# Patient Record
Sex: Female | Born: 1950 | Hispanic: No
Health system: Southern US, Community
[De-identification: ages and names within clinical notes are randomized; demographics above are authoritative.]

## PROBLEM LIST (undated history)

## (undated) DIAGNOSIS — E669 Obesity, unspecified: Secondary | ICD-10-CM

---

## 1998-04-28 ENCOUNTER — Other Ambulatory Visit: Admission: RE | Admit: 1998-04-28 | Discharge: 1998-04-28 | Payer: Self-pay | Admitting: Obstetrics and Gynecology

## 2007-09-08 ENCOUNTER — Ambulatory Visit: Payer: Self-pay | Admitting: Internal Medicine

## 2019-08-19 ENCOUNTER — Other Ambulatory Visit: Payer: Self-pay | Admitting: Physician Assistant

## 2019-08-19 DIAGNOSIS — Z1231 Encounter for screening mammogram for malignant neoplasm of breast: Secondary | ICD-10-CM

## 2019-09-22 ENCOUNTER — Ambulatory Visit: Payer: Self-pay

## 2020-06-01 ENCOUNTER — Ambulatory Visit: Payer: Self-pay

## 2021-01-27 ENCOUNTER — Emergency Department (HOSPITAL_COMMUNITY): Payer: Medicare PPO

## 2021-01-27 ENCOUNTER — Observation Stay (HOSPITAL_BASED_OUTPATIENT_CLINIC_OR_DEPARTMENT_OTHER): Payer: Medicare PPO

## 2021-01-27 ENCOUNTER — Observation Stay (HOSPITAL_COMMUNITY): Payer: Medicare PPO

## 2021-01-27 ENCOUNTER — Other Ambulatory Visit: Payer: Self-pay

## 2021-01-27 ENCOUNTER — Observation Stay (HOSPITAL_COMMUNITY)
Admission: EM | Admit: 2021-01-27 | Discharge: 2021-01-28 | Disposition: A | Discharge status: Home / Self Care | Payer: Medicare PPO | Attending: Internal Medicine | Admitting: Internal Medicine

## 2021-01-27 ENCOUNTER — Encounter (HOSPITAL_COMMUNITY): Payer: Self-pay | Admitting: Emergency Medicine

## 2021-01-27 DIAGNOSIS — Z6831 Body mass index (BMI) 31.0-31.9, adult: Secondary | ICD-10-CM | POA: Insufficient documentation

## 2021-01-27 DIAGNOSIS — G458 Other transient cerebral ischemic attacks and related syndromes: Secondary | ICD-10-CM | POA: Diagnosis not present

## 2021-01-27 DIAGNOSIS — G459 Transient cerebral ischemic attack, unspecified: Secondary | ICD-10-CM | POA: Diagnosis not present

## 2021-01-27 DIAGNOSIS — R0689 Other abnormalities of breathing: Secondary | ICD-10-CM | POA: Diagnosis present

## 2021-01-27 DIAGNOSIS — E669 Obesity, unspecified: Secondary | ICD-10-CM | POA: Insufficient documentation

## 2021-01-27 DIAGNOSIS — R569 Unspecified convulsions: Secondary | ICD-10-CM | POA: Diagnosis not present

## 2021-01-27 DIAGNOSIS — Y900 Blood alcohol level of less than 20 mg/100 ml: Secondary | ICD-10-CM | POA: Diagnosis not present

## 2021-01-27 DIAGNOSIS — R29898 Other symptoms and signs involving the musculoskeletal system: Secondary | ICD-10-CM | POA: Insufficient documentation

## 2021-01-27 DIAGNOSIS — Z20822 Contact with and (suspected) exposure to covid-19: Secondary | ICD-10-CM | POA: Insufficient documentation

## 2021-01-27 DIAGNOSIS — G9340 Encephalopathy, unspecified: Secondary | ICD-10-CM | POA: Diagnosis not present

## 2021-01-27 DIAGNOSIS — R531 Weakness: Secondary | ICD-10-CM

## 2021-01-27 HISTORY — DX: Obesity, unspecified: E66.9

## 2021-01-27 LAB — CBC
HCT: 41.7 % (ref 36.0–46.0)
Hemoglobin: 13.7 g/dL (ref 12.0–15.0)
MCH: 32.2 pg (ref 26.0–34.0)
MCHC: 32.9 g/dL (ref 30.0–36.0)
MCV: 97.9 fL (ref 80.0–100.0)
Platelets: 158 10*3/uL (ref 150–400)
RBC: 4.26 MIL/uL (ref 3.87–5.11)
RDW: 12.3 % (ref 11.5–15.5)
WBC: 7 10*3/uL (ref 4.0–10.5)
nRBC: 0 % (ref 0.0–0.2)

## 2021-01-27 LAB — DIFFERENTIAL
Abs Immature Granulocytes: 0.05 10*3/uL (ref 0.00–0.07)
Basophils Absolute: 0 10*3/uL (ref 0.0–0.1)
Basophils Relative: 1 %
Eosinophils Absolute: 0.2 10*3/uL (ref 0.0–0.5)
Eosinophils Relative: 3 %
Immature Granulocytes: 1 %
Lymphocytes Relative: 27 %
Lymphs Abs: 1.9 10*3/uL (ref 0.7–4.0)
Monocytes Absolute: 0.7 10*3/uL (ref 0.1–1.0)
Monocytes Relative: 9 %
Neutro Abs: 4.2 10*3/uL (ref 1.7–7.7)
Neutrophils Relative %: 59 %

## 2021-01-27 LAB — COMPREHENSIVE METABOLIC PANEL
ALT: 22 U/L (ref 0–44)
AST: 31 U/L (ref 15–41)
Albumin: 3.5 g/dL (ref 3.5–5.0)
Alkaline Phosphatase: 74 U/L (ref 38–126)
Anion gap: 8 (ref 5–15)
BUN: 11 mg/dL (ref 8–23)
CO2: 21 mmol/L — ABNORMAL LOW (ref 22–32)
Calcium: 8.5 mg/dL — ABNORMAL LOW (ref 8.9–10.3)
Chloride: 110 mmol/L (ref 98–111)
Creatinine, Ser: 0.91 mg/dL (ref 0.44–1.00)
GFR, Estimated: >60 mL/min (ref >60)
Glucose, Bld: 120 mg/dL — ABNORMAL HIGH (ref 70–99)
Potassium: 3.8 mmol/L (ref 3.5–5.1)
Sodium: 139 mmol/L (ref 135–145)
Total Bilirubin: 0.8 mg/dL (ref 0.3–1.2)
Total Protein: 6 g/dL — ABNORMAL LOW (ref 6.5–8.1)

## 2021-01-27 LAB — ECHOCARDIOGRAM COMPLETE
Area-P 1/2: 3.85 cm2
Height: 63 in
S' Lateral: 2.3 cm
Weight: 2848 oz

## 2021-01-27 LAB — URINALYSIS, ROUTINE W REFLEX MICROSCOPIC
Bilirubin Urine: NEGATIVE
Glucose, UA: NEGATIVE mg/dL
Hgb urine dipstick: NEGATIVE
Ketones, ur: NEGATIVE mg/dL
Nitrite: NEGATIVE
Protein, ur: NEGATIVE mg/dL
Specific Gravity, Urine: 1.017 (ref 1.005–1.030)
pH: 5 (ref 5.0–8.0)

## 2021-01-27 LAB — RESP PANEL BY RT-PCR (FLU A&B, COVID) ARPGX2
Influenza A by PCR: NEGATIVE
Influenza B by PCR: NEGATIVE
SARS Coronavirus 2 by RT PCR: NEGATIVE

## 2021-01-27 LAB — I-STAT CHEM 8, ED
BUN: 12 mg/dL (ref 8–23)
Calcium, Ion: 1.12 mmol/L — ABNORMAL LOW (ref 1.15–1.40)
Chloride: 110 mmol/L (ref 98–111)
Creatinine, Ser: 0.8 mg/dL (ref 0.44–1.00)
Glucose, Bld: 119 mg/dL — ABNORMAL HIGH (ref 70–99)
HCT: 39 % (ref 36.0–46.0)
Hemoglobin: 13.3 g/dL (ref 12.0–15.0)
Potassium: 3.8 mmol/L (ref 3.5–5.1)
Sodium: 142 mmol/L (ref 135–145)
TCO2: 22 mmol/L (ref 22–32)

## 2021-01-27 LAB — RAPID URINE DRUG SCREEN, HOSP PERFORMED
Amphetamines: NOT DETECTED
Barbiturates: NOT DETECTED
Benzodiazepines: NOT DETECTED
Cocaine: NOT DETECTED
Opiates: NOT DETECTED
Tetrahydrocannabinol: NOT DETECTED

## 2021-01-27 LAB — APTT: aPTT: 28 seconds (ref 24–36)

## 2021-01-27 LAB — MAGNESIUM: Magnesium: 2.2 mg/dL (ref 1.7–2.4)

## 2021-01-27 LAB — ETHANOL: Alcohol, Ethyl (B): <10 mg/dL (ref <10)

## 2021-01-27 LAB — PROTIME-INR
INR: 1 (ref 0.8–1.2)
Prothrombin Time: 13.2 seconds (ref 11.4–15.2)

## 2021-01-27 LAB — HEMOGLOBIN A1C
Hgb A1c MFr Bld: 5.3 % (ref 4.8–5.6)
Mean Plasma Glucose: 105.41 mg/dL

## 2021-01-27 LAB — CHOLESTEROL, TOTAL: Cholesterol: 191 mg/dL (ref 0–200)

## 2021-01-27 MED ORDER — ACETAMINOPHEN 160 MG/5ML PO SOLN: 650.0000 mg | ORAL | Status: DC | PRN

## 2021-01-27 MED ORDER — ASPIRIN EC 81 MG PO TBEC
81.0000 mg | DELAYED_RELEASE_TABLET | Freq: Every day | ORAL | Status: DC
  Administered 2021-01-27 – 2021-01-28 (×2): 81 mg via ORAL
  Filled 2021-01-27 (×2): qty 1

## 2021-01-27 MED ORDER — ACETAMINOPHEN 325 MG PO TABS: 650.0000 mg | ORAL_TABLET | ORAL | Status: DC | PRN

## 2021-01-27 MED ORDER — STROKE: EARLY STAGES OF RECOVERY BOOK
Freq: Once | Status: AC
  Filled 2021-01-27: qty 1

## 2021-01-27 MED ORDER — ACETAMINOPHEN 650 MG RE SUPP: 650.0000 mg | RECTAL | Status: DC | PRN

## 2021-01-27 MED ORDER — SENNOSIDES-DOCUSATE SODIUM 8.6-50 MG PO TABS: 1.0000 | ORAL_TABLET | Freq: Every evening | ORAL | Status: DC | PRN

## 2021-01-27 MED ORDER — ENOXAPARIN SODIUM 30 MG/0.3ML IJ SOSY
30.0000 mg | PREFILLED_SYRINGE | INTRAMUSCULAR | Status: DC
  Filled 2021-01-27: qty 0.3

## 2021-01-27 MED ORDER — ONDANSETRON HCL 4 MG PO TABS: 4.0000 mg | ORAL_TABLET | Freq: Three times a day (TID) | ORAL | Status: DC | PRN

## 2021-01-27 NOTE — Progress Notes (Addendum)
Called to bedside to discuss finding and plan of care with patient and her daughter.  The patient and her daughter at bedside was met, all questions were answered, updated regarding current findings. Patient reports she has some cardiovascular disorder, with easily bruising. Requested if Lovenox could be discontinued..  Per patient request and her daughter Lovenox will be discontinued. Patient will be continued on aspirin, and SCDs.   Current findings plan of care has been discussed with the patient and daughter in detail, they currently expressed understanding and agreement with current plan.    SIGNED: Deatra James, MD, FHM. [facility],  Pager (please use [email or web address] to page/text)  Please use Epic Secure Chat for non-urgent communication (7AM-7PM) If 7PM-7AM, please contact night-coverage [email or web address],  01/27/2021, 2:32 PM

## 2021-01-27 NOTE — ED Triage Notes (Signed)
Pt BIB EMS from home. PT husband reports that they went to sleep around 930pm and around 140am he woke up to her heavily breathing and could not wake her up. Fire reported that pt had l sided weakness and l sided facial droop. When EMS arrived pt was AOx4 but slow to respond - answered questions appropriately and had no weakness or facial drip.  Pt has no medical hx and does not take any meds.  20LAC  Bp 156/84 EKG show NSR  CBG 129  Pt on RA

## 2021-01-27 NOTE — Progress Notes (Signed)
SLP Cancellation Note  Patient Details Name: Stacey Conrad MRN: [id number] DOB: Jan 31, 1951   Cancelled treatment:       Reason Eval/Treat Not Completed: SLP screened, no needs identified, will sign off  Katherine Tout L. Samson Frederic, MA CCC/SLP Acute Rehabilitation Services Office number [phone] Pager [phone]  Stacey Conrad [name] 01/27/2021, 3:01 PM

## 2021-01-27 NOTE — Progress Notes (Signed)
EEG completed, results pending No skin breakdown observed.

## 2021-01-27 NOTE — Progress Notes (Signed)
Physical Therapy Evaluation and Discharge Patient Details Name: Stacey Conrad MRN: [id number] DOB: 1950/10/08 Today's Date: 01/27/2021   History of Present Illness  Pt is a 70 y/o female admitted secondary to AMS likely from encephalopathy. MRI negative. No pertinent PMH.  Clinical Impression  Patient evaluated by Physical Therapy with no further acute PT needs identified. All education has been completed and the patient has no further questions. Pt overall steady and performing gait and stair navigation at a mod I level. No LOB noted and pt asymptomatic throughout. Pt reports husband can assist if needed at d/c. See below for any follow-up Physical Therapy or equipment needs. PT is signing off. Thank you for this referral. If needs change, please re-consult.      Follow Up Recommendations No PT follow up    Equipment Recommendations  None recommended by PT    Recommendations for Other Services       Precautions / Restrictions Precautions Precautions: None Restrictions Weight Bearing Restrictions: No      Mobility  Bed Mobility Overal bed mobility: Independent                  Transfers Overall transfer level: Modified independent                  Ambulation/Gait Ambulation/Gait assistance: Modified independent (Device/Increase time) Gait Distance (Feet): 150 Feet Assistive device: None Gait Pattern/deviations: Step-through pattern;Decreased stride length Gait velocity: mildly decreased   General Gait Details: Mildly slower gait speed, but overall steady. Reporting mild stiffness in L knee, but otherwise tolerated well.  Stairs Stairs: Yes Stairs assistance: Modified independent (Device/Increase time) Stair Management: One rail Right;Alternating pattern Number of Stairs: 4 General stair comments: Overall steady gait using rail. No LOB noted.  Wheelchair Mobility    Modified Rankin (Stroke Patients Only)       Balance Overall balance  assessment: Modified Independent                                           Pertinent Vitals/Pain Pain Assessment: No/denies pain    Home Living Family/patient expects to be discharged to:: Private residence Living Arrangements: Spouse/significant other Available Help at Discharge: Family Type of Home: House Home Access: Stairs to enter Entrance Stairs-Rails: Right Entrance Stairs-Number of Steps: 7 Home Layout: One level Home Equipment: None      Prior Function Level of Independence: Independent               Hand Dominance        Extremity/Trunk Assessment   Upper Extremity Assessment Upper Extremity Assessment: Overall WFL for tasks assessed    Lower Extremity Assessment Lower Extremity Assessment: LLE deficits/detail LLE Deficits / Details: Reporting L knee stiffness    Cervical / Trunk Assessment Cervical / Trunk Assessment: Normal  Communication   Communication: No difficulties  Cognition Arousal/Alertness: Awake/alert Behavior During Therapy: WFL for tasks assessed/performed Overall Cognitive Status: Within Functional Limits for tasks assessed                                        General Comments General comments (skin integrity, edema, etc.): Pt's husband present at end of session.    Exercises     Assessment/Plan    PT Assessment Patent does not need any  further PT services  PT Problem List         PT Treatment Interventions      PT Goals (Current goals can be found in the Care Plan section)  Acute Rehab PT Goals Patient Stated Goal: to go home PT Goal Formulation: With patient Time For Goal Achievement: 01/27/21 Potential to Achieve Goals: Good    Frequency     Barriers to discharge        Co-evaluation               AM-PAC PT "6 Clicks" Mobility  Outcome Measure Help needed turning from your back to your side while in a flat bed without using bedrails?: None Help needed moving  from lying on your back to sitting on the side of a flat bed without using bedrails?: None Help needed moving to and from a bed to a chair (including a wheelchair)?: None Help needed standing up from a chair using your arms (e.g., wheelchair or bedside chair)?: None Help needed to walk in hospital room?: None Help needed climbing 3-5 steps with a railing? : None 6 Click Score: 24    End of Session Equipment Utilized During Treatment: Gait belt Activity Tolerance: Patient tolerated treatment well Patient left: in chair;with call bell/phone within reach;with family/visitor present Nurse Communication: Mobility status PT Visit Diagnosis: Other symptoms and signs involving the nervous system (R29.898)    Time: 0086-7619 PT Time Calculation (min) (ACUTE ONLY): 17 min   Charges:   PT Evaluation $PT Eval Low Complexity: 1 Low          Cindee Salt, DPT  Acute Rehabilitation Services  Pager: [phone] Office: [phone]   Lehman Prom 01/27/2021, 4:29 PM

## 2021-01-27 NOTE — Progress Notes (Signed)
Echocardiogram 2D Echocardiogram with strain and 3D has been performed.  Leta Jungling M 01/27/2021, 1:48 PM

## 2021-01-27 NOTE — H&P (Addendum)
History and Physical    Stacey Conrad:[id number] DOB: 1951-01-25 DOA: 01/27/2021  Referring MD/NP/PA:  PCP: [facility] Patient coming from: home  Chief Complaint: acute encephalopathy  HPI: Stacey Conrad is a very pleasant 70 y.o. female with no significant medical history presents to the emergency department chief complaint of acute encephalopathy.  Formations obtained from the patient and her husband and daughter who are both at the bedside.  Patient reports she was in her usual state of health went to bed last night.  Husband said in the middle of the night she began "thrashing her legs about in bed and her breathing became rapid and deep".  He reports he tried to arouse her and she was not responsive.  Her eyes were not open.  There was no incontinence.  Daughter who is a nurse came and at that time patient's eyes were open she was not thrashing about in bed but was unresponsive to verbal stimuli.  #1 1 was called who noted some possible left-sided weakness.  Within about 10 minutes she came around.  Her only memory is going to bed last night and awakening to a "room full of people".  She denies headache dizziness visual disturbances.  She denies any chest pain palpitations shortness of breath.  She denies abdominal pain nausea vomiting diarrhea constipation melena.  She denies dysuria hematuria frequency or urgency.  She she denies any numbness or tingling of her extremities difficulty chewing or swallowing.  Was transported to the emergency department.  ED Course: In the emergency department she was provided with aspirin.  No code stroke was initiated as there were no neurological symptoms upon arrival.  Neurology was contacted by telephone who recommended a full TIA work-up and admission to medicine.  As the swallow eval in the emergency department.  Dr. Amada Jupiter indicated patient will be seen in consultation.  Review of Systems: As per HPI otherwise 10 point review of  systems negative.   Past Medical History:  Diagnosis Date  . Obesity     History reviewed. No pertinent surgical history.   has no history on file for tobacco use, alcohol use, and drug use.  Allergies  Allergen Reactions  . Lidocaine-Epinephrine Swelling    Family History  Problem Relation Age of Onset  . CAD Mother      Prior to Admission medications   Medication Sig Start Date End Date Taking? Authorizing Provider  b complex vitamins capsule Take 1 capsule by mouth daily as needed (when exercising).   Yes [provider]  ibuprofen (ADVIL) 200 MG tablet Take 200 mg by mouth every 6 (six) hours as needed for headache or moderate pain.   Yes [provider]    Physical Exam: Vitals:   01/27/21 0500 01/27/21 0621 01/27/21 0630 01/27/21 0700  BP: 138/62 (!) 155/69 (!) 145/68 (!) 143/61  Pulse: 84 80 76 70  Resp: (!) Temp:      TempSrc:      SpO2: 98% 99% 98% 97%  Weight:  80.7 kg    Height:   (1.6 m)        Constitutional: NAD, calm, comfortable Vitals:   01/27/21 0500 01/27/21 0621 01/27/21 0630 01/27/21 0700  BP: 138/62 (!) 155/69 (!) 145/68 (!) 143/61  Pulse: 84 80 76 70  Resp: (!) Temp:      TempSrc:      SpO2: 98% 99% 98% 97%  Weight:  80.7 kg    Height:  5\' 3"  (1.6 m)     Eyes: PERRL, lids and conjunctivae normal ENMT: Mucous membranes are slightly dry. Posterior pharynx clear of any exudate or lesions.Normal dentition. Tongue with red patches at tip and on left. No bleeding Neck: normal, supple, no masses, no thyromegaly Respiratory: clear to auscultation bilaterally, no wheezing, no crackles. Normal respiratory effort. No accessory muscle use.  Cardiovascular: Regular rate and rhythm, no murmurs / rubs / gallops. No extremity edema. 2+ pedal pulses. No carotid bruits.  Abdomen: no tenderness, obese, no masses palpated. No hepatosplenomegaly. Bowel sounds positive.  Musculoskeletal: no clubbing /  cyanosis. No joint deformity upper and lower extremities. Good ROM, no contractures. Normal muscle tone.  Skin: no rashes, lesions, ulcers. No induration Neurologic: CN 2-12 grossly intact. Sensation intact, DTR normal. Strength 5/5 in all 4.  Psychiatric: Normal judgment and insight. Alert and oriented x 3. Normal mood.   Labs on Admission: I have personally reviewed following labs and imaging studies  CBC: Recent Labs  Lab 01/27/21 0321 01/27/21 0342  WBC 7.0  --   NEUTROABS 4.2  --   HGB 13.7 13.3  HCT 41.7 39.0  MCV 97.9  --   PLT 158  --    Basic Metabolic Panel: Recent Labs  Lab 01/27/21 0321 01/27/21 0342  NA 139 142  K 3.8 3.8  CL 110 110  CO2 21*  --   GLUCOSE 120* 119*  BUN 11 12  CREATININE 0.91 0.80  CALCIUM 8.5*  --    GFR: Estimated Creatinine Clearance: 64.9 mL/min (by C-G formula based on SCr of 0.8 mg/dL). Liver Function Tests: Recent Labs  Lab 01/27/21 0321  AST 31  ALT 22  ALKPHOS 74  BILITOT 0.8  PROT 6.0*  ALBUMIN 3.5   No results for input(s): LIPASE, AMYLASE in the last 168 hours. No results for input(s): AMMONIA in the last 168 hours. Coagulation Profile: Recent Labs  Lab 01/27/21 0321  INR 1.0   Cardiac Enzymes: No results for input(s): CKTOTAL, CKMB, CKMBINDEX, TROPONINI in the last 168 hours. BNP (last 3 results) No results for input(s): PROBNP in the last 8760 hours. HbA1C: Recent Labs    01/27/21 0328  HGBA1C 5.3   CBG: No results for input(s): GLUCAP in the last 168 hours. Lipid Profile: Recent Labs    01/27/21 0321  CHOL 191   Thyroid Function Tests: No results for input(s): TSH, T4TOTAL, FREET4, T3FREE, THYROIDAB in the last 72 hours. Anemia Panel: No results for input(s): VITAMINB12, FOLATE, FERRITIN, TIBC, IRON, RETICCTPCT in the last 72 hours. Urine analysis:    Component Value Date/Time   COLORURINE YELLOW 01/27/2021 0502   APPEARANCEUR CLEAR 01/27/2021 0502   LABSPEC 1.017 01/27/2021 0502   PHURINE  5.0 01/27/2021 0502   GLUCOSEU NEGATIVE 01/27/2021 0502   HGBUR NEGATIVE 01/27/2021 0502   BILIRUBINUR NEGATIVE 01/27/2021 0502   KETONESUR NEGATIVE 01/27/2021 0502   PROTEINUR NEGATIVE 01/27/2021 0502   NITRITE NEGATIVE 01/27/2021 0502   LEUKOCYTESUR TRACE (A) 01/27/2021 0502   Sepsis Labs: @LABRCNTIP (procalcitonin:4,lacticidven:4) ) Recent Results (from the past 240 hour(s))  Resp Panel by RT-PCR (Flu A&B, Covid) Nasopharyngeal Swab     Status: None   Collection Time: 01/27/21  3:21 AM   Specimen: Nasopharyngeal Swab; Nasopharyngeal(NP) swabs in vial transport medium  Result Value Ref Range Status   SARS Coronavirus 2 by RT PCR NEGATIVE NEGATIVE Final    Comment: (NOTE) SARS-CoV-2 target nucleic acids are NOT  DETECTED.  The SARS-CoV-2 RNA is generally detectable in upper respiratory specimens during the acute phase of infection. The lowest concentration of SARS-CoV-2 viral copies this assay can detect is 138 copies/mL. A negative result does not preclude SARS-Cov-2 infection and should not be used as the sole basis for treatment or other patient management decisions. A negative result may occur with  improper specimen collection/handling, submission of specimen other than nasopharyngeal swab, presence of viral mutation(s) within the areas targeted by this assay, and inadequate number of viral copies(<138 copies/mL). A negative result must be combined with clinical observations, patient history, and epidemiological information. The expected result is Negative.  Fact Sheet for Patients:  BloggerCourse.com  Fact Sheet for Healthcare Providers:  SeriousBroker.it  This test is no t yet approved or cleared by the [place] FDA and  has been authorized for detection and/or diagnosis of SARS-CoV-2 by FDA under an Emergency Use Authorization (EUA). This EUA will remain  in effect (meaning this test can be used) for the duration  of the COVID-19 declaration under Section 564(b)(1) of the Act, 21 U.S.C.section 360bbb-3(b)(1), unless the authorization is terminated  or revoked sooner.       Influenza A by PCR NEGATIVE NEGATIVE Final   Influenza B by PCR NEGATIVE NEGATIVE Final    Comment: (NOTE) The Xpert Xpress SARS-CoV-2/FLU/RSV plus assay is intended as an aid in the diagnosis of influenza from Nasopharyngeal swab specimens and should not be used as a sole basis for treatment. Nasal washings and aspirates are unacceptable for Xpert Xpress SARS-CoV-2/FLU/RSV testing.  Fact Sheet for Patients: BloggerCourse.com  Fact Sheet for Healthcare Providers: SeriousBroker.it  This test is not yet approved or cleared by the [place] FDA and has been authorized for detection and/or diagnosis of SARS-CoV-2 by FDA under an Emergency Use Authorization (EUA). This EUA will remain in effect (meaning this test can be used) for the duration of the COVID-19 declaration under Section 564(b)(1) of the Act, 21 U.S.C. section 360bbb-3(b)(1), unless the authorization is terminated or revoked.  Performed at [facility], [address], [place], [place] [address]      Radiological Exams on Admission: MR ANGIO HEAD WO CONTRAST  Result Date: 01/27/2021 CLINICAL DATA:  Difficult to arouse. EXAM: MRI HEAD WITHOUT CONTRAST MRA HEAD WITHOUT CONTRAST TECHNIQUE: Multiplanar, multi-echo pulse sequences of the brain and surrounding structures were acquired without intravenous contrast. Angiographic images of the Circle of Willis were acquired using MRA technique without intravenous contrast. COMPARISON: No pertinent prior exam. COMPARISON:  Head CT from earlier today FINDINGS: MRI HEAD FINDINGS Brain: No acute infarction, hemorrhage, hydrocephalus, extra-axial collection or mass lesion. Mild chronic small vessel ischemia in the cerebral white matter. Age normal brain volume  Vascular: Normal flow voids. Skull and upper cervical spine: Normal marrow signal. Sinuses/Orbits: Bilateral cataract resection MRA HEAD FINDINGS Anterior circulation: No stenosis, beading, branch occlusion, aneurysm, or vascular malformation. Posterior circulation: The right vertebral artery is dominant. Fetal type left PCA. No stenosis, branch occlusion, beading, or aneurysm. Anatomic variants: As above IMPRESSION: 1. No acute finding or explanation for symptoms. 2. Mild chronic small vessel ischemia. 3. Negative intracranial MRA. Electronically Signed   By: Marnee Spring M.D.   On: 01/27/2021 06:17   MR BRAIN WO CONTRAST  Result Date: 01/27/2021 CLINICAL DATA:  Difficult to arouse. EXAM: MRI HEAD WITHOUT CONTRAST MRA HEAD WITHOUT CONTRAST TECHNIQUE: Multiplanar, multi-echo pulse sequences of the brain and surrounding structures were acquired without intravenous contrast. Angiographic images of the Circle  of Willis were acquired using MRA technique without intravenous contrast. COMPARISON: No pertinent prior exam. COMPARISON:  Head CT from earlier today FINDINGS: MRI HEAD FINDINGS Brain: No acute infarction, hemorrhage, hydrocephalus, extra-axial collection or mass lesion. Mild chronic small vessel ischemia in the cerebral white matter. Age normal brain volume Vascular: Normal flow voids. Skull and upper cervical spine: Normal marrow signal. Sinuses/Orbits: Bilateral cataract resection MRA HEAD FINDINGS Anterior circulation: No stenosis, beading, branch occlusion, aneurysm, or vascular malformation. Posterior circulation: The right vertebral artery is dominant. Fetal type left PCA. No stenosis, branch occlusion, beading, or aneurysm. Anatomic variants: As above IMPRESSION: 1. No acute finding or explanation for symptoms. 2. Mild chronic small vessel ischemia. 3. Negative intracranial MRA. Electronically Signed   By: Marnee SpringJonathon  Watts M.D.   On: 01/27/2021 06:17   DG Chest Portable 1 View  Result Date:  01/27/2021 CLINICAL DATA:  TIA.  Woke up with heavy breathing. EXAM: PORTABLE CHEST 1 VIEW COMPARISON:  None. FINDINGS: The heart size and mediastinal contours are within normal limits. No focal consolidation. No pulmonary edema. No pleural effusion. No pneumothorax. No acute osseous abnormality. IMPRESSION: No active disease. Electronically Signed   By: Tish FredericksonMorgane  Naveau M.D.   On: 01/27/2021 04:07   CT HEAD CODE STROKE WO CONTRAST  Result Date: 01/27/2021 CLINICAL DATA:  Code stroke.  TIA with symptoms not specified EXAM: CT HEAD WITHOUT CONTRAST TECHNIQUE: Contiguous axial images were obtained from the base of the skull through the vertex without intravenous contrast. COMPARISON:  None. FINDINGS: Brain: No evidence of acute infarction, hemorrhage, hydrocephalus, extra-axial collection or mass lesion/mass effect. Mild periventricular chronic small vessel ischemia. Age normal brain volume Vascular: No hyperdense vessel or unexpected calcification. Skull: Normal. Negative for fracture or focal lesion. Sinuses/Orbits: No acute finding. ASPECTS [facility]([place] Stroke Program Early CT Score) Not scored with this history IMPRESSION: No acute finding.  Unremarkable appearance of the brain for age. Electronically Signed   By: Marnee SpringJonathon  Watts M.D.   On: 01/27/2021 04:06    EKG: Independently reviewed. NSR  Assessment/Plan Principal Problem:   Encephalopathy acute Active Problems:   TIA (transient ischemic attack)   Left-sided weakness   Acute encephalopathy  #1.  Acute encephalopathy/TIA/questionable seizure.  No signs of infection.  No indication metabolic derangement.  Chest x-ray without acute findings.  No events on EKG.  Symptoms resolved in the emergency department.  Head with no acute findings.  MRI/MRA of the head without contrast no acute findings or explanation for symptoms.  Neuro exam within the limits of normal.  By neurology recommended medicine admission with TIA work-up.  Neurology also recommended  EEG. -EEG per neurology -Magnesium level per neurology; lipid panel -Neurochecks per protocol -PT OT consult -Aspirin -Further recommendations per neurology   DVT prophylaxis: Lovenox  Code Status: Full Family Communication: Husband and daughter at bedside Disposition Plan: Home when work-up complete may be late today likely tomorrow Consults called: Dr. Amada JupiterKirkpatrick with neurology Admission status: Obs  Gwenyth BenderBLACK,KAREN M NP [facility]   If 7PM-7AM, please contact night-coverage [email or web address] Password [email or web address]  01/27/2021, 8:41 AM   Addendum:  Attestation: The patient was seen and examined independently, all history, labs, and work-up has reviewed independently.     Current findings, plan of care care was discussed with NP in detail. I agree with all current findings and plan of care.   Plan of care was also discussed with patient patient, she expresses understanding and agreement with current plan.   SIGNED: Kendell BaneSeyed A Creola Krotz, MD, FHM.  [facility],  Pager (please use [email or web address] to page/text)  Please use Epic Secure Chat for non-urgent communication (7AM-7PM) If 7PM-7AM, please contact night-coverage [email or web address],  01/27/2021, 10:44 AM

## 2021-01-27 NOTE — Procedures (Signed)
Patient Name: Stacey Conrad  MRN: [id number]  Epilepsy Attending: Charlsie Quest  Referring Physician/Provider: Toya Smothers, NP Date: 01/27/2021  Duration: 22.58 mins  Patient history:  70 year old female with an episode of unresponsiveness with lateral tongue biting.  EEG to evaluate for seizures.  Level of alertness: Awake, asleep  AEDs during EEG study: None  Technical aspects: This EEG study was done with scalp electrodes positioned according to the 10-20 International system of electrode placement. Electrical activity was acquired at a sampling rate of 500Hz  and reviewed with a high frequency filter of 70Hz  and a low frequency filter of 1Hz . EEG data were recorded continuously and digitally stored.   Description: The posterior dominant rhythm consists of 9-10 Hz activity of moderate voltage (25-35 uV) seen predominantly in posterior head regions, symmetric and reactive to eye opening and eye closing. Sleep was characterized by vertex waves, sleep spindles (12 to 14 Hz), maximal frontocentral region. Hyperventilation and photic stimulation were not performed.     IMPRESSION: This study is within normal limits. No seizures or epileptiform discharges were seen throughout the recording.  Nyajah Hyson 

## 2021-01-27 NOTE — ED Provider Notes (Signed)
[facility] EMERGENCY DEPARTMENT Provider Note   CSN: [id number] Arrival date & time: 01/27/21  0319     History Chief Complaint  Patient presents with  . Stroke like symptoms     Stacey Conrad is a 70 y.o. female.  The history is provided by the patient and the EMS personnel.  Weakness Severity:  Severe Onset quality:  Sudden Timing:  Constant Progression:  Resolved Chronicity:  New Context: not change in medication and not recent infection   Relieved by:  Nothing Worsened by:  Nothing Ineffective treatments:  None tried Associated symptoms: no aphasia, no chest pain, no cough, no diarrhea, no drooling, no fever, no headaches, no nausea, no seizures, no sensory-motor deficit, no shortness of breath and no vision change   Risk factors: no neurologic disease   Patient with no significant past medical history who was last seen normal at 930 pm presents by EMS after an episode of heavy breathing and being difficult to arouse at home at approximately 140 am.  Fire reported left sided weakness and facial droop.  When EMS arrived symptoms were no longer present.  Patient is currently AOx4 and has no complaints of weakness, numbness, visual changes and has intact speech.  Patient denies: chest pain, shortness of breath, nausea, vomiting, abdominal pain, diarrhea, cough, rashes on the skin, and all urinary complaints.       History reviewed. No pertinent past medical history.  There are no problems to display for this patient.   History reviewed. No pertinent surgical history.   OB History   No obstetric history on file.     History reviewed. No pertinent family history.     Home Medications Prior to Admission medications   Not on File    Allergies    Patient has no allergy information on record.  Review of Systems   Review of Systems  Constitutional: Negative for fever.  HENT: Negative for drooling.   Respiratory: Negative for cough and  shortness of breath.   Cardiovascular: Negative for chest pain.  Gastrointestinal: Negative for diarrhea and nausea.  Neurological: Positive for facial asymmetry and weakness. Negative for seizures and headaches.  All other systems reviewed and are negative.   Physical Exam Updated Vital Signs BP (!) 159/75 (BP Location: Right Arm)   Pulse 79   Temp 97.8 F (36.6 C) (Oral)   SpO2 97%   Physical Exam Vitals and nursing note reviewed.  Constitutional:      General: She is not in acute distress.    Appearance: Normal appearance. She is not diaphoretic.  HENT:     Head: Normocephalic and atraumatic.     Nose: Nose normal.     Mouth/Throat:     Mouth: Mucous membranes are moist.     Pharynx: Oropharynx is clear.  Eyes:     Extraocular Movements: Extraocular movements intact.     Conjunctiva/sclera: Conjunctivae normal.     Pupils: Pupils are equal, round, and reactive to light.  Cardiovascular:     Rate and Rhythm: Normal rate and regular rhythm.     Pulses: Normal pulses.     Heart sounds: Normal heart sounds.  Pulmonary:     Effort: Pulmonary effort is normal.     Breath sounds: Normal breath sounds. No wheezing or rales.  Abdominal:     General: Abdomen is flat. Bowel sounds are normal.     Palpations: Abdomen is soft.     Tenderness: There is no abdominal tenderness.  There is no guarding.  Musculoskeletal:        General: Normal range of motion.     Cervical back: Normal range of motion and neck supple.     Right lower leg: No edema.     Left lower leg: No edema.  Lymphadenopathy:     Cervical: No cervical adenopathy.  Skin:    General: Skin is warm and dry.     Capillary Refill: Capillary refill takes less than 2 seconds.  Neurological:     General: No focal deficit present.     Mental Status: She is alert and oriented to person, place, and time.     Cranial Nerves: No cranial nerve deficit.     Sensory: No sensory deficit.     Motor: No weakness.     Deep  Tendon Reflexes: Reflexes normal.  Psychiatric:        Mood and Affect: Mood normal.        Behavior: Behavior normal.     ED Results / Procedures / Treatments   Labs (all labs ordered are listed, but only abnormal results are displayed) Results for orders placed or performed during the hospital encounter of 01/27/21  Protime-INR  Result Value Ref Range   Prothrombin Time 13.2 11.4 - 15.2 seconds   INR 1.0 0.8 - 1.2  APTT  Result Value Ref Range   aPTT 28 24 - 36 seconds  CBC  Result Value Ref Range   WBC 7.0 4.0 - 10.5 K/uL   RBC 4.26 3.87 - 5.11 MIL/uL   Hemoglobin 13.7 12.0 - 15.0 g/dL   HCT 40.9 81.1 - 91.4 %   MCV 97.9 80.0 - 100.0 fL   MCH 32.2 26.0 - 34.0 pg   MCHC 32.9 30.0 - 36.0 g/dL   RDW 78.2 95.6 - 21.3 %   Platelets 158 150 - 400 K/uL   nRBC 0.0 0.0 - 0.2 %  Differential  Result Value Ref Range   Neutrophils Relative % 59 %   Neutro Abs 4.2 1.7 - 7.7 K/uL   Lymphocytes Relative 27 %   Lymphs Abs 1.9 0.7 - 4.0 K/uL   Monocytes Relative 9 %   Monocytes Absolute 0.7 0.1 - 1.0 K/uL   Eosinophils Relative 3 %   Eosinophils Absolute 0.2 0.0 - 0.5 K/uL   Basophils Relative 1 %   Basophils Absolute 0.0 0.0 - 0.1 K/uL   Immature Granulocytes 1 %   Abs Immature Granulocytes 0.05 0.00 - 0.07 K/uL  I-stat chem 8, ED  Result Value Ref Range   Sodium 142 135 - 145 mmol/L   Potassium 3.8 3.5 - 5.1 mmol/L   Chloride 110 98 - 111 mmol/L   BUN 12 8 - 23 mg/dL   Creatinine, Ser 0.86 0.44 - 1.00 mg/dL   Glucose, Bld 578 (H) 70 - 99 mg/dL   Calcium, Ion 4.69 (L) 1.15 - 1.40 mmol/L   TCO2 22 22 - 32 mmol/L   Hemoglobin 13.3 12.0 - 15.0 g/dL   HCT 62.9 52.8 - 41.3 %   DG Chest Portable 1 View  Result Date: 01/27/2021 CLINICAL DATA:  TIA.  Woke up with heavy breathing. EXAM: PORTABLE CHEST 1 VIEW COMPARISON:  None. FINDINGS: The heart size and mediastinal contours are within normal limits. No focal consolidation. No pulmonary edema. No pleural effusion. No  pneumothorax. No acute osseous abnormality. IMPRESSION: No active disease. Electronically Signed   By: Tish Frederickson M.D.   On: 01/27/2021 04:07  CT HEAD CODE STROKE WO CONTRAST  Result Date: 01/27/2021 CLINICAL DATA:  Code stroke.  TIA with symptoms not specified EXAM: CT HEAD WITHOUT CONTRAST TECHNIQUE: Contiguous axial images were obtained from the base of the skull through the vertex without intravenous contrast. COMPARISON:  None. FINDINGS: Brain: No evidence of acute infarction, hemorrhage, hydrocephalus, extra-axial collection or mass lesion/mass effect. Mild periventricular chronic small vessel ischemia. Age normal brain volume Vascular: No hyperdense vessel or unexpected calcification. Skull: Normal. Negative for fracture or focal lesion. Sinuses/Orbits: No acute finding. ASPECTS [facility] Stroke Program Early CT Score) Not scored with this history IMPRESSION: No acute finding.  Unremarkable appearance of the brain for age. Electronically Signed   By: Marnee Spring M.D.   On: 01/27/2021 04:06    EKG  EKG Interpretation  Date/Time:  Friday Jan 27 2021 03:28:26 EDT Ventricular Rate:  78 PR Interval:  163 QRS Duration: 95 QT Interval:  389 QTC Calculation: 444 R Axis:   17 Text Interpretation: Sinus rhythm Confirmed by Nicanor Alcon, Kshawn Canal ([id number]) on 01/27/2021 4:15:48 AM        Procedures Procedures    ED Course  I have reviewed the triage vital signs and the nursing notes.  Pertinent labs & imaging results that were available during my care of the patient were reviewed by me and considered in my medical decision making (see chart for details).   Code stroke was not initiated in the ED because there were no neurologic symptoms on arrival.     Patient seen immediately by EDP, even prior to patient having a room.    EDP examined patient and ordered labs and imaging and spoke with neurology immediately  324 am: case d/w Dr. Amada Jupiter of neurology by phone.  He agrees to a  full TIA work up and admission to medicine.  Dr. Amada Jupiter will see the patient in consult   Final Clinical Impression(s) / ED Diagnoses Final diagnoses:  TIA (transient ischemic attack)   Admit to medicine    Metha Kolasa, MD 01/27/21 [id number]

## 2021-01-27 NOTE — Progress Notes (Signed)
OT Cancellation Note  Patient Details Name: Stacey Conrad MRN: [id number] DOB: Dec 15, 1950   Cancelled Treatment:    Reason Eval/Treat Not Completed: OT screened, no needs identified, will sign off (Pt in hallway walking with PT. Reports she feels back to baseline. Will sign off. Please reconsult if status changes. Thank you.)  Mikele Sifuentes M Tou Hayner Terral Cooks MSOT, OTR/L Acute Rehab Pager: [phone] Office: [phone] 01/27/2021, 3:09 PM

## 2021-01-27 NOTE — Consult Note (Signed)
Neurology Consultation Reason for Consult: Unresponsive episode Referring Physician: Daun Peacock, a  CC: Unresponsiveness  History is obtained from: Patient  HPI: Stacey Conrad is a 70 y.o. female with no significant past medical history who was in her normal state of health when she went to bed last night.  Her husband states that he awoke with an abnormal sound.  He states that she was breathing "funny."  He also noticed some abnormal movements of her legs.  He is not sure if they were outstretched or flexed or if they were thrashing or twitching, but it was not a high amplitude movement.  He was more focused on her not being responsive to him at the time.  He states that her eyes were closed, she was drooling and he was unable to wake her.  She gradually came around over the next 10 minutes.  From her perception, she went to bed normally and then the next thing she knows there was a bunch of EMS personnel in her room.  She is now back to her baseline.  No recent headaches, fevers, or other recent symptoms.  ROS: A 14 point ROS was performed and is negative except as noted in the HPI.   History reviewed. No pertinent past medical history.   History reviewed. No pertinent family history.   Social History: Occasional wine, maybe once a week   Exam: Current vital signs: BP (!) 155/69 (BP Location: Right Arm)   Pulse 80   Temp 97.8 F (36.6 C) (Oral)   Resp 18   Ht 5\' 3"  (1.6 m)   Wt 80.7 kg   SpO2 99%   BMI 31.53 kg/m  Vital signs in last 24 hours: Temp:  [97.8 F (36.6 C)] 97.8 F (36.6 C) (05/13 0311) Pulse Rate:  [75-84] 80 (05/13 0621) Resp:  [18-27] 18 (05/13 0621) BP: (134-162)/(62-77) 155/69 (05/13 0621) SpO2:  [97 %-100 %] 99 % (05/13 0621) Weight:  [80.7 kg] 80.7 kg (05/13 09-05-2000)   Physical Exam  Constitutional: Appears well-developed and well-nourished.  Psych: Affect appropriate to situation Eyes: No scleral injection HENT: No OP obstruction MSK: no joint  deformities.  Cardiovascular: Normal rate and regular rhythm.  Respiratory: Effort normal, non-labored breathing GI: Soft.  No distension. There is no tenderness.  Skin: WDI  Neuro: Mental Status: Patient is awake, alert, oriented to person, place, month, year, and situation. Patient is able to give a clear and coherent history. No signs of aphasia or neglect Cranial Nerves: II: Visual Fields are full. Pupils are equal, round, and reactive to light.   III,IV, VI: EOMI without ptosis or diploplia.  V: Facial sensation is symmetric to temperature VII: Facial movement is symmetric.  VIII: hearing is intact to voice X: Uvula elevates symmetrically XI: Shoulder shrug is symmetric. XII: tongue is midline without atrophy or fasciculations.  Motor: Tone is normal. Bulk is normal. 5/5 strength was present in all four extremities.  Sensory: Sensation is symmetric to light touch and temperature in the arms and legs. Cerebellar: FNF and HKS are intact bilaterally   I have reviewed labs in epic and the results pertinent to this consultation are: UDS is negative Sodium 139 Creatinine 0.9 Calcium 8.5 with an albumin of 3.5 CBC is normal  I have reviewed the images obtained: MRI brain is negative, no clear seizure focus  Impression: 70 year old female with an episode of unresponsiveness with lateral tongue biting.  I suspect that this most likely represents seizure rather than TIA or stroke.  Given that this is the first episode, and no definite convulsion was seen, I am slightly hesitant to start medications.  I do think an EEG would be a reasonable, but if this is negative then I am not certain I would start meds.  I think that TIA/stroke is much less likely.  Recommendations: 1) EEG 2) magnesium  3) neurology will follow    Ritta Slot, MD [facility] [phone]  If 7pm- 7am, please page neurology on call as listed in AMION.

## 2021-01-27 NOTE — Plan of Care (Addendum)
Patient Progress  Problem: Education: Goal: Knowledge of disease or condition will improve Outcome: Progressing  Goal: Knowledge of secondary prevention will improve 01/27/2021 2339 by Renard Hamper, RN Outcome: Progressing Goal: Knowledge of patient specific risk factors addressed and post discharge goals established will improve 01/27/2021 2339 by Renard Hamper, RN Outcome: Progressing   Problem: Nutrition: Goal: Risk of aspiration will decrease 01/27/2021 2339 by Renard Hamper, RN Outcome: Progressing   Problem: Ischemic Stroke/TIA Tissue Perfusion: Goal: Complications of ischemic stroke/TIA will be minimized 01/27/2021 2339 by Renard Hamper, RN Outcome: Progressing

## 2021-01-27 NOTE — ED Notes (Signed)
EEG currently in progress at bedside. Pt will transport to room when completed.

## 2021-01-28 DIAGNOSIS — G458 Other transient cerebral ischemic attacks and related syndromes: Secondary | ICD-10-CM | POA: Diagnosis not present

## 2021-01-28 LAB — HEMOGLOBIN A1C
Hgb A1c MFr Bld: 5.4 % (ref 4.8–5.6)
Mean Plasma Glucose: 108.28 mg/dL

## 2021-01-28 LAB — LIPID PANEL
Cholesterol: 197 mg/dL (ref 0–200)
HDL: 47 mg/dL (ref >40)
LDL Cholesterol: 124 mg/dL — ABNORMAL HIGH (ref 0–99)
Total CHOL/HDL Ratio: 4.2 RATIO
Triglycerides: 130 mg/dL (ref <150)
VLDL: 26 mg/dL (ref 0–40)

## 2021-01-28 MED ORDER — ASPIRIN 81 MG PO TBEC
81.0000 mg | DELAYED_RELEASE_TABLET | Freq: Every day | ORAL | 11 refills | Status: AC
Start: 2021-01-28 — End: ?

## 2021-01-28 MED ORDER — ATORVASTATIN CALCIUM 20 MG PO TABS: 20.0000 mg | ORAL_TABLET | Freq: Every day | ORAL | 2 refills | Status: DC

## 2021-01-28 NOTE — Discharge Summary (Addendum)
[facility]  Physician Discharge Summary   Patient ID: Stacey Conrad MRN: [id number] DOB/AGE: [birth year] 70 y.o.  Admit date: 01/27/2021 Discharge date: 01/28/2021  PCP: Pcp, No  DISCHARGE DIAGNOSES:  Transient altered level of consciousness, possible seizure versus TIA  RECOMMENDATIONS FOR OUTPATIENT FOLLOW UP: 1. Patient will discuss referral to neurology with her primary care provider in [place]. She would prefer to see a neurologist in that area.    Home Health: None Equipment/Devices: None  CODE STATUS: Full code  DISCHARGE CONDITION: fair  Diet recommendation: As before  INITIAL HISTORY: 70 y.o. female with no significant medical history presented to the emergency department chief complaint of acute encephalopathy.     Consultations:  Neurology  Procedures:  EEG did not show any epileptiform or seizure activity  Echocardiogram See below   HOSPITAL COURSE:   Transient altered level of consciousness Patient apparently had transient altered level of consciousness with tongue biting.  There was some concern for possible left-sided weakness.  Patient was seen by neurology.  Underwent EEG which did not show any epileptiform activity.  It was felt that patient may have had a seizure episode rather than ischemic episode.  MRI did not show any acute stroke.  Since this was her first episode of possible seizure no treatment recommended by neurology.  They do recommend no driving for 6 months.  Follow-up with outpatient neurology.  This was discussed with patient.  She would prefer to follow-up with a neurologist in the [place] area.  She will talk to her PCP about this.  Patient's LDL was noted to be 124.  Statin was discussed and will be ordered.  Patient was noted to be otherwise stable.  Cleared by neurology.  Neurology discussed with patient and her daughter in detail.  Obesity Estimated body mass index is 31.53 kg/m as calculated from the  following:   Height as of this encounter: 5\' 3"  (1.6 m).   Weight as of this encounter: 80.7 kg.  Okay for discharge today.    PERTINENT LABS:  The results of significant diagnostics from this hospitalization (including imaging, microbiology, ancillary and laboratory) are listed below for reference.    Microbiology: Recent Results (from the past 240 hour(s))  Resp Panel by RT-PCR (Flu A&B, Covid) Nasopharyngeal Swab     Status: None   Collection Time: 01/27/21  3:21 AM   Specimen: Nasopharyngeal Swab; Nasopharyngeal(NP) swabs in vial transport medium  Result Value Ref Range Status   SARS Coronavirus 2 by RT PCR NEGATIVE NEGATIVE Final    Comment: (NOTE) SARS-CoV-2 target nucleic acids are NOT DETECTED.  The SARS-CoV-2 RNA is generally detectable in upper respiratory specimens during the acute phase of infection. The lowest concentration of SARS-CoV-2 viral copies this assay can detect is 138 copies/mL. A negative result does not preclude SARS-Cov-2 infection and should not be used as the sole basis for treatment or other patient management decisions. A negative result may occur with  improper specimen collection/handling, submission of specimen other than nasopharyngeal swab, presence of viral mutation(s) within the areas targeted by this assay, and inadequate number of viral copies(<138 copies/mL). A negative result must be combined with clinical observations, patient history, and epidemiological information. The expected result is Negative.  Fact Sheet for Patients:  01/29/21  Fact Sheet for Healthcare Providers:  BloggerCourse.com  This test is no t yet approved or cleared by the [place] FDA and  has been authorized for detection and/or diagnosis of SARS-CoV-2 by FDA under an Emergency  Use Authorization (EUA). This EUA will remain  in effect (meaning this test can be used) for the duration of the COVID-19  declaration under Section 564(b)(1) of the Act, 21 U.S.C.section 360bbb-3(b)(1), unless the authorization is terminated  or revoked sooner.       Influenza A by PCR NEGATIVE NEGATIVE Final   Influenza B by PCR NEGATIVE NEGATIVE Final    Comment: (NOTE) The Xpert Xpress SARS-CoV-2/FLU/RSV plus assay is intended as an aid in the diagnosis of influenza from Nasopharyngeal swab specimens and should not be used as a sole basis for treatment. Nasal washings and aspirates are unacceptable for Xpert Xpress SARS-CoV-2/FLU/RSV testing.  Fact Sheet for Patients: BloggerCourse.comhttps://www.fda.gov/media/152166/download  Fact Sheet for Healthcare Providers: SeriousBroker.ithttps://www.fda.gov/media/152162/download  This test is not yet approved or cleared by the [place] FDA and has been authorized for detection and/or diagnosis of SARS-CoV-2 by FDA under an Emergency Use Authorization (EUA). This EUA will remain in effect (meaning this test can be used) for the duration of the COVID-19 declaration under Section 564(b)(1) of the Act, 21 U.S.C. section 360bbb-3(b)(1), unless the authorization is terminated or revoked.  Performed at [facility], [address] [place], [place] [address]      Labs:  COVID-19 Labs   Lab Results  Component Value Date   SARSCOV2NAA NEGATIVE 01/27/2021      Basic Metabolic Panel: Recent Labs  Lab 01/27/21 0321 01/27/21 0342  NA 139 142  K 3.8 3.8  CL 110 110  CO2 21*  --   GLUCOSE 120* 119*  BUN 11 12  CREATININE 0.91 0.80  CALCIUM 8.5*  --   MG 2.2  --    Liver Function Tests: Recent Labs  Lab 01/27/21 0321  AST 31  ALT 22  ALKPHOS 74  BILITOT 0.8  PROT 6.0*  ALBUMIN 3.5   CBC: Recent Labs  Lab 01/27/21 0321 01/27/21 0342  WBC 7.0  --   NEUTROABS 4.2  --   HGB 13.7 13.3  HCT 41.7 39.0  MCV 97.9  --   PLT 158  --      IMAGING STUDIES MR ANGIO HEAD WO CONTRAST  Result Date: 01/27/2021 CLINICAL DATA:  Difficult to arouse. EXAM: MRI  HEAD WITHOUT CONTRAST MRA HEAD WITHOUT CONTRAST TECHNIQUE: Multiplanar, multi-echo pulse sequences of the brain and surrounding structures were acquired without intravenous contrast. Angiographic images of the Circle of Willis were acquired using MRA technique without intravenous contrast. COMPARISON: No pertinent prior exam. COMPARISON:  Head CT from earlier today FINDINGS: MRI HEAD FINDINGS Brain: No acute infarction, hemorrhage, hydrocephalus, extra-axial collection or mass lesion. Mild chronic small vessel ischemia in the cerebral white matter. Age normal brain volume Vascular: Normal flow voids. Skull and upper cervical spine: Normal marrow signal. Sinuses/Orbits: Bilateral cataract resection MRA HEAD FINDINGS Anterior circulation: No stenosis, beading, branch occlusion, aneurysm, or vascular malformation. Posterior circulation: The right vertebral artery is dominant. Fetal type left PCA. No stenosis, branch occlusion, beading, or aneurysm. Anatomic variants: As above IMPRESSION: 1. No acute finding or explanation for symptoms. 2. Mild chronic small vessel ischemia. 3. Negative intracranial MRA. Electronically Signed   By: Marnee SpringJonathon  Watts M.D.   On: 01/27/2021 06:17   MR BRAIN WO CONTRAST  Result Date: 01/27/2021 CLINICAL DATA:  Difficult to arouse. EXAM: MRI HEAD WITHOUT CONTRAST MRA HEAD WITHOUT CONTRAST TECHNIQUE: Multiplanar, multi-echo pulse sequences of the brain and surrounding structures were acquired without intravenous contrast. Angiographic images of the Circle of Willis were acquired using MRA technique  without intravenous contrast. COMPARISON: No pertinent prior exam. COMPARISON:  Head CT from earlier today FINDINGS: MRI HEAD FINDINGS Brain: No acute infarction, hemorrhage, hydrocephalus, extra-axial collection or mass lesion. Mild chronic small vessel ischemia in the cerebral white matter. Age normal brain volume Vascular: Normal flow voids. Skull and upper cervical spine: Normal marrow  signal. Sinuses/Orbits: Bilateral cataract resection MRA HEAD FINDINGS Anterior circulation: No stenosis, beading, branch occlusion, aneurysm, or vascular malformation. Posterior circulation: The right vertebral artery is dominant. Fetal type left PCA. No stenosis, branch occlusion, beading, or aneurysm. Anatomic variants: As above IMPRESSION: 1. No acute finding or explanation for symptoms. 2. Mild chronic small vessel ischemia. 3. Negative intracranial MRA. Electronically Signed   By: Marnee Spring M.D.   On: 01/27/2021 06:17   DG Chest Portable 1 View  Result Date: 01/27/2021 CLINICAL DATA:  TIA.  Woke up with heavy breathing. EXAM: PORTABLE CHEST 1 VIEW COMPARISON:  None. FINDINGS: The heart size and mediastinal contours are within normal limits. No focal consolidation. No pulmonary edema. No pleural effusion. No pneumothorax. No acute osseous abnormality. IMPRESSION: No active disease. Electronically Signed   By: Tish Frederickson M.D.   On: 01/27/2021 04:07   EEG adult  Result Date: 01/27/2021 Charlsie Quest, MD     01/27/2021 11:31 AM Patient Name: Stacey Conrad MRN: [id number] Epilepsy Attending: Charlsie Quest Referring Physician/Provider: Toya Smothers, NP Date: 01/27/2021 Duration: 22.58 mins Patient history:  70 year old female with an episode of unresponsiveness with lateral tongue biting.  EEG to evaluate for seizures. Level of alertness: Awake, asleep AEDs during EEG study: None Technical aspects: This EEG study was done with scalp electrodes positioned according to the 10-20 International system of electrode placement. Electrical activity was acquired at a sampling rate of  and reviewed with a high frequency filter of  and a low frequency filter of . EEG data were recorded continuously and digitally stored. Description: The posterior dominant rhythm consists of 9-10 Hz activity of moderate voltage (25-35 uV) seen predominantly in posterior head regions, symmetric and reactive to  eye opening and eye closing. Sleep was characterized by vertex waves, sleep spindles (12 to 14 Hz), maximal frontocentral region. Hyperventilation and photic stimulation were not performed.   IMPRESSION: This study is within normal limits. No seizures or epileptiform discharges were seen throughout the recording. Charlsie Quest   ECHOCARDIOGRAM COMPLETE  Result Date: 01/27/2021    ECHOCARDIOGRAM REPORT   Patient Name:   Stacey Conrad Date of Exam: 01/27/2021 Medical Rec #:  [id number]       Height:       63.0 in Accession #:    [id number]      Weight:       178.0 lb Date of Birth:  1951/08/11       BSA:          1.840 m Patient Age:    69 years        BP:           152/76 mmHg Patient Gender: F               HR:           70 bpm. Exam Location:  Inpatient Procedure: 2D Echo, 3D Echo, Cardiac Doppler, Color Doppler and Strain Analysis Indications:    TIA G45.9  History:        Patient has no prior history of Echocardiogram examinations. No  prior cardiac history.  Sonographer:    Leta Jungling RDCS Referring Phys: [phone] KAREN M BLACK IMPRESSIONS  1. Left ventricular ejection fraction, by estimation, is 60 to 65%. The left ventricle has normal function. The left ventricle has no regional wall motion abnormalities. Left ventricular diastolic parameters are consistent with Grade I diastolic dysfunction (impaired relaxation).  2. Right ventricular systolic function is normal. The right ventricular size is normal.  3. Left atrial size was mildly dilated.  4. The mitral valve is normal in structure. No evidence of mitral valve regurgitation. No evidence of mitral stenosis.  5. The aortic valve is tricuspid. There is mild calcification of the aortic valve. Aortic valve regurgitation is not visualized. No aortic stenosis is present.  6. The inferior vena cava is normal in size with greater than 50% respiratory variability, suggesting right atrial pressure of 3 mmHg. FINDINGS  Left Ventricle: Left  ventricular ejection fraction, by estimation, is 60 to 65%. The left ventricle has normal function. The left ventricle has no regional wall motion abnormalities. The left ventricular internal cavity size was normal in size. There is  no left ventricular hypertrophy. Left ventricular diastolic parameters are consistent with Grade I diastolic dysfunction (impaired relaxation). Right Ventricle: The right ventricular size is normal. No increase in right ventricular wall thickness. Right ventricular systolic function is normal. Left Atrium: Left atrial size was mildly dilated. Right Atrium: Right atrial size was normal in size. Pericardium: There is no evidence of pericardial effusion. Mitral Valve: The mitral valve is normal in structure. No evidence of mitral valve regurgitation. No evidence of mitral valve stenosis. Tricuspid Valve: The tricuspid valve is normal in structure. Tricuspid valve regurgitation is trivial. No evidence of tricuspid stenosis. Aortic Valve: The aortic valve is tricuspid. There is mild calcification of the aortic valve. Aortic valve regurgitation is not visualized. No aortic stenosis is present. Pulmonic Valve: The pulmonic valve was normal in structure. Pulmonic valve regurgitation is not visualized. No evidence of pulmonic stenosis. Aorta: The aortic root is normal in size and structure. Venous: The inferior vena cava is normal in size with greater than 50% respiratory variability, suggesting right atrial pressure of 3 mmHg. IAS/Shunts: No atrial level shunt detected by color flow Doppler.  LEFT VENTRICLE PLAX 2D LVIDd:         3.80 cm  Diastology LVIDs:         2.30 cm  LV e' medial:    6.31 cm/s LV PW:         0.90 cm  LV E/e' medial:  13.1 LV IVS:        1.00 cm  LV e' lateral:   5.33 cm/s LVOT diam:     1.90 cm  LV E/e' lateral: 15.5 LV SV:         49 LV SV Index:   27 LVOT Area:     2.84 cm  RIGHT VENTRICLE RV S prime:     21.10 cm/s TAPSE (M-mode): 2.9 cm LEFT ATRIUM             Index        RIGHT ATRIUM           Index LA diam:        3.70 cm 2.01 cm/m  RA Area:     11.00 cm LA Vol (A2C):   33.3 ml 18.10 ml/m RA Volume:   23.70 ml  12.88 ml/m LA Vol (A4C):   34.6 ml 18.80 ml/m LA Biplane Vol: 34.5  ml 18.75 ml/m  AORTIC VALVE LVOT Vmax:   96.00 cm/s LVOT Vmean:  50.800 cm/s LVOT VTI:    0.173 m  AORTA Ao Root diam: 3.10 cm Ao Asc diam:  3.50 cm MITRAL VALVE MV Area (PHT): 3.85 cm    SHUNTS MV Decel Time: 197 msec    Systemic VTI:  0.17 m MV E velocity: 82.50 cm/s  Systemic Diam: 1.90 cm MV A velocity: 98.00 cm/s MV E/A ratio:  0.84 Arvilla Meres MD Electronically signed by Arvilla Meres MD Signature Date/Time: 01/27/2021/4:19:54 PM    Final    CT HEAD CODE STROKE WO CONTRAST  Result Date: 01/27/2021 CLINICAL DATA:  Code stroke.  TIA with symptoms not specified EXAM: CT HEAD WITHOUT CONTRAST TECHNIQUE: Contiguous axial images were obtained from the base of the skull through the vertex without intravenous contrast. COMPARISON:  None. FINDINGS: Brain: No evidence of acute infarction, hemorrhage, hydrocephalus, extra-axial collection or mass lesion/mass effect. Mild periventricular chronic small vessel ischemia. Age normal brain volume Vascular: No hyperdense vessel or unexpected calcification. Skull: Normal. Negative for fracture or focal lesion. Sinuses/Orbits: No acute finding. ASPECTS [facility] Stroke Program Early CT Score) Not scored with this history IMPRESSION: No acute finding.  Unremarkable appearance of the brain for age. Electronically Signed   By: Marnee Spring M.D.   On: 01/27/2021 04:06    DISCHARGE EXAMINATION: Vitals:   01/27/21 2324 01/28/21 0333 01/28/21 0728 01/28/21 1120  BP: 139/72 (!) 142/66 134/71 137/66  Pulse: 74 69 (!) 57 66  Resp: 16 17 16 19   Temp: 98 F (36.7 C) 97.9 F (36.6 C) 98 F (36.7 C) 98 F (36.7 C)  TempSrc: Oral Oral Oral Oral  SpO2: 97% 96%  97%  Weight:      Height:       General appearance: Awake alert.  In no  distress Resp: Clear to auscultation bilaterally.  Normal effort Cardio: S1-S2 is normal regular.  No S3-S4.  No rubs murmurs or bruit GI: Abdomen is soft.  Nontender nondistended.  Bowel sounds are present normal.  No masses organomegaly    DISPOSITION: Home  Discharge Instructions    Call MD for:  difficulty breathing, headache or visual disturbances   Complete by: As directed    Call MD for:  extreme fatigue   Complete by: As directed    Call MD for:  persistant dizziness or light-headedness   Complete by: As directed    Call MD for:  persistant nausea and vomiting   Complete by: As directed    Call MD for:  severe uncontrolled pain   Complete by: As directed    Call MD for:  temperature >100.4   Complete by: As directed    Diet - low sodium heart healthy   Complete by: As directed    Discharge instructions   Complete by: As directed    Please follow-up with your primary care provider within 1 to 2 weeks and have them refer you to a local neurologist for further management of your condition.  Since this may have been your first seizure episodes typically these are not treated with medications.  You should not drive any motor vehicle for 6 months and if there is no recurrence of these episodes then you may be cleared for driving at that point. Please note that we have also started you on a medication for your high cholesterol as discussed.  You were cared for by a hospitalist during your hospital stay. If you have  any questions about your discharge medications or the care you received while you were in the hospital after you are discharged, you can call the unit and asked to speak with the hospitalist on call if the hospitalist that took care of you is not available. Once you are discharged, your primary care physician will handle any further medical issues. Please note that NO REFILLS for any discharge medications will be authorized once you are discharged, as it is imperative that  you return to your primary care physician (or establish a relationship with a primary care physician if you do not have one) for your aftercare needs so that they can reassess your need for medications and monitor your lab values. If you do not have a primary care physician, you can call [phone] for a physician referral.   Increase activity slowly   Complete by: As directed         Allergies as of 01/28/2021      Reactions   Lidocaine-epinephrine Swelling      Medication List    TAKE these medications   aspirin 81 MG EC tablet Take 1 tablet (81 mg total) by mouth daily. Swallow whole.   atorvastatin 20 MG tablet Commonly known as: LIPITOR Take 1 tablet (20 mg total) by mouth daily.   b complex vitamins capsule Take 1 capsule by mouth daily as needed (when exercising).   ibuprofen 200 MG tablet Commonly known as: ADVIL Take 200 mg by mouth every 6 (six) hours as needed for headache or moderate pain.         Follow-up Information    Patrice Paradise, MD. Schedule an appointment as soon as possible for a visit in 1 week(s).   Specialty: Physician Assistant Contact information: [address] [facility] [place] [address] [phone]               TOTAL DISCHARGE TIME: 35 minutes  Stacey Conrad  [facility] Pager on [email or web address]  01/29/2021, 10:02 AM

## 2021-01-28 NOTE — Progress Notes (Signed)
Brief Neuro Update:  MRI Brain without contrast negative for any structural abnormality that puts her at risk of seizures. Routine EEG with no epileptogenic abnormalities.  This is her first seizure and with a negative MRI Brain and a routine EEG, her risk of a recurrent seizure in the future is low. We will therefore hold off on starting any Antiepileptic medications at this time. If she doea have a recurrent seizure in the future, would recommend starting her on an antiepileptic agent.  Recs: - No further inpatient neurologic workup - Seizure precautions at discharge as listed below. - neurology inpatient team will signoff. Please feel free to contact us with any questions or concerns.    Seizure precautions: Per [facility] statutes, patients with seizures are not allowed to drive until they have been seizure-free for six months and cleared by a physician    Use caution when using heavy equipment or power tools. Avoid working on ladders or at heights. Take showers instead of baths. Ensure the water temperature is not too high on the home water heater. Do not go swimming alone. Do not lock yourself in a room alone (i.e. bathroom). When caring for infants or small children, sit down when holding, feeding, or changing them to minimize risk of injury to the child in the event you have a seizure. Maintain good sleep hygiene. Avoid alcohol.    If patient has another seizure, call 911 and bring them back to the ED if: A.  The seizure lasts longer than 5 minutes.      B.  The patient doesn't wake shortly after the seizure or has new problems such as difficulty seeing, speaking or moving following the seizure C.  The patient was injured during the seizure D.  The patient has a temperature over 102 F (39C) E.  The patient vomited during the seizure and now is having trouble breathing    During the Seizure   - First, ensure adequate ventilation and place patients on the floor on their left  side  Loosen clothing around the neck and ensure the airway is patent. If the patient is clenching the teeth, do not force the mouth open with any object as this can cause severe damage - Remove all items from the surrounding that can be hazardous. The patient may be oblivious to what's happening and may not even know what he or she is doing. If the patient is confused and wandering, either gently guide him/her away and block access to outside areas - Reassure the individual and be comforting - Call 911. In most cases, the seizure ends before EMS arrives. However, there are cases when seizures may last over 3 to 5 minutes. Or the individual may have developed breathing difficulties or severe injuries. If a pregnant patient or a person with diabetes develops a seizure, it is prudent to call an ambulance. - Finally, if the patient does not regain full consciousness, then call EMS. Most patients will remain confused for about 45 to 90 minutes after a seizure, so you must use judgment in calling for help. - Avoid restraints but make sure the patient is in a bed with padded side rails - Place the individual in a lateral position with the neck slightly flexed; this will help the saliva drain from the mouth and prevent the tongue from falling backward - Remove all nearby furniture and other hazards from the area - Provide verbal assurance as the individual is regaining consciousness - Provide the patient  with privacy if possible - Call for help and start treatment as ordered by the caregiver    After the Seizure (Postictal Stage)   After a seizure, most patients experience confusion, fatigue, muscle pain and/or a headache. Thus, one should permit the individual to sleep. For the next few days, reassurance is essential. Being calm and helping reorient the person is also of importance.   Most seizures are painless and end spontaneously. Seizures are not harmful to others but can lead to complications such  as stress on the lungs, brain and the heart. Individuals with prior lung problems may develop labored breathing and respiratory distress.   Erick Blinks [facility] Pager Number [phone]

## 2021-01-28 NOTE — Discharge Instructions (Signed)
Seizure precautions: Per [facility] statutes, patients with seizures are not allowed to drive until they have been seizure-free for six months and cleared by a physician   Use caution when using heavy equipment or power tools. Avoid working on ladders or at heights. Take showers instead of baths. Ensure the water temperature is not too high on the home water heater. Do not go swimming alone. Do not lock yourself in a room alone (i.e. bathroom). When caring for infants or small children, sit down when holding, feeding, or changing them to minimize risk of injury to the child in the event you have a seizure. Maintain good sleep hygiene. Avoid alcohol.   If patienthas another seizure, call 911 and bring them back to the ED if: A. The seizure lasts longer than 5 minutes.  B. The patient doesn't wake shortly after the seizure or has new problems such as difficulty seeing, speaking or moving following the seizure C. The patient was injured during the seizure D. The patient has a temperature over 102 F (39C) E. The patient vomited during the seizure and now is having trouble breathing  During the Seizure  - First, ensure adequate ventilation and place patients on the floor on their left side  Loosen clothing around the neck and ensure the airway is patent. If the patient is clenching the teeth, do not force the mouth open with any object as this can cause severe damage - Remove all items from the surrounding that can be hazardous. The patient may be oblivious to what's happening and may not even know what he or she is doing. If the patient is confused and wandering, either gently guide him/her away and block access to outside areas - Reassure the individual and be comforting - Call 911. In most cases, the seizure ends before EMS arrives. However, there are cases when seizures may last over 3 to 5 minutes. Or the individual may have developed breathing difficulties or severe  injuries. If a pregnant patient or a person with diabetes develops a seizure, it is prudent to call an ambulance. - Finally, if the patient does not regain full consciousness, then call EMS. Most patients will remain confused for about 45 to 90 minutes after a seizure, so you must use judgment in calling for help. - Avoid restraints but make sure the patient is in a bed with padded side rails - Place the individual in a lateral position with the neck slightly flexed; this will help the saliva drain from the mouth and prevent the tongue from falling backward - Remove all nearby furniture and other hazards from the area - Provide verbal assurance as the individual is regaining consciousness - Provide the patient with privacy if possible - Call for help and start treatment as ordered by the caregiver  After the Seizure (Postictal Stage)  After a seizure, most patients experience confusion, fatigue, muscle pain and/or a headache. Thus, one should permit the individual to sleep. For the next few days, reassurance is essential. Being calm and helping reorient the person is also of importance.  Most seizures are painless and end spontaneously. Seizures are not harmful to others but can lead to complications such as stress on the lungs, brain and the heart. Individuals with prior lung problems may develop labored breathing and respiratory distress.   Seizure, Adult A seizure is a sudden burst of abnormal electrical and chemical activity in the brain. Seizures usually last from 30 seconds to 2 minutes.  What are  the causes? Common causes of this condition include:  Fever or infection.  Problems that affect the brain. These may include: ? A brain or head injury. ? Bleeding in the brain. ? A brain tumor.  Low levels of blood sugar or salt.  Kidney problems or liver problems.  Conditions that are passed from parent to child (are inherited).  Problems with a substance, such as: ? Having a  reaction to a drug or a medicine. ? Stopping the use of a substance all of a sudden (withdrawal).  A stroke.  Disorders that affect how you develop. Sometimes, the cause may not be known.  What increases the risk?  Having someone in your family who has epilepsy. In this condition, seizures happen again and again over time. They have no clear cause.  Having had a tonic-clonic seizure before. This type of seizure causes you to: ? Tighten the muscles of the whole body. ? Lose consciousness.  Having had a head injury or strokes before.  Having had a lack of oxygen at birth. What are the signs or symptoms? There are many types of seizures. The symptoms vary depending on the type of seizure you have. Symptoms during a seizure  Shaking that you cannot control (convulsions) with fast, jerky movements of muscles.  Stiffness of the body.  Breathing problems.  Feeling mixed up (confused).  Staring or not responding to sound or touch.  Head nodding.  Eyes that blink, flutter, or move fast.  Drooling, grunting, or making clicking sounds with your mouth  Losing control of when you pee or poop. Symptoms before a seizure  Feeling afraid, nervous, or worried.  Feeling like you may vomit.  Feeling like: ? You are moving when you are not. ? Things around you are moving when they are not.  Feeling like you saw or heard something before (dj vu).  Odd tastes or smells.  Changes in how you see. You may see flashing lights or spots. Symptoms after a seizure  Feeling confused.  Feeling sleepy.  Headache.  Sore muscles. How is this treated? If your seizure stops on its own, you will not need treatment. If your seizure lasts longer than 5 minutes, you will normally need treatment. Treatment may include:  Medicines given through an IV tube.  Avoiding things, such as medicines, that are known to cause your seizures.  Medicines to prevent seizures.  A device to prevent or  control seizures.  Surgery.  A diet low in carbohydrates and high in fat (ketogenic diet). Follow these instructions at home: Medicines  Take over-the-counter and prescription medicines only as told by your doctor.  Avoid foods or drinks that may keep your medicine from working, such as alcohol. Activity  Follow instructions about driving, swimming, or doing things that would be dangerous if you had another seizure. Wait until your doctor says it is safe for you to do these things.  If you live in the [place] ask your local department of motor vehicles when you can drive.  Get a lot of rest. Teaching others  Teach friends and family what to do when you have a seizure. They should: ? Help you get down to the ground. ? Protect your head and body. ? Loosen any clothing around your neck. ? Turn you on your side. ? Know whether or not you need emergency care. ? Stay with you until you are better.  Also, tell them what not to do if you have a seizure. Tell them: ?  They should not hold you down. ? They should not put anything in your mouth.   General instructions  Avoid anything that gives you seizures.  Keep a seizure diary. Write down: ? What you remember about each seizure. ? What you think caused each seizure.  Keep all follow-up visits. Contact a doctor if:  You have another seizure or seizures. Call the doctor each time you have a seizure.  The pattern of your seizures changes.  You keep having seizures with treatment.  You have symptoms of being sick or having an infection.  You are not able to take your medicine. Get help right away if:  You have any of these problems: ? A seizure that lasts longer than 5 minutes. ? Many seizures in a row and you do not feel better between seizures. ? A seizure that makes it harder to breathe. ? A seizure and you can no longer speak or use part of your body.  You do not wake up right after a seizure.  You get hurt during a  seizure.  You feel confused or have pain right after a seizure. These symptoms may be an emergency. Get help right away. Call your local emergency services (911 in the [place]  Do not wait to see if the symptoms will go away.  Do not drive yourself to the hospital. Summary  A seizure is a sudden burst of abnormal electrical and chemical activity in the brain. Seizures normally last from 30 seconds to 2 minutes.  Causes of seizures include illness, injury to the head, low levels of blood sugar or salt, and certain conditions.  Most seizures will stop on their own in less than 5 minutes. Seizures that last longer than 5 minutes are a medical emergency and need treatment right away.  Many medicines are used to treat seizures. Take over-the-counter and prescription medicines only as told by your doctor. This information is not intended to replace advice given to you by your health care provider. Make sure you discuss any questions you have with your health care provider. Document Revised: 03/11/2020 Document Reviewed: 03/11/2020 Elsevier Patient Education  2021 [employer].

## 2021-01-28 NOTE — Progress Notes (Signed)
Patient ready for discharge. Requested Neurology to bedside to address final questions and concerns. Neuro MD to bedside addressed all questions to pt and family satisfaction. IVs removed. Telemetry discontinued. Family is aware of discharge. AVS given and VSS. Will continue to monitor patient until transportation arrives.

## 2021-01-28 NOTE — Care Management Obs Status (Signed)
MEDICARE OBSERVATION STATUS NOTIFICATION   Patient Details  Name: Stacey Conrad MRN: [id number] Date of Birth: 08-Jan-1951   Medicare Observation Status Notification Given:  Yes    Lawerance Sabal, RN 01/28/2021, 12:10 PM

## 2022-06-02 IMAGING — MR MR MRA HEAD W/O CM
12 of 13 series · 44 of 48 positions shown · non-contrast
Comparison: No pertinent prior exam.
COMPARISON: Head CT from earlier today

CLINICAL DATA: Difficult to arouse.

EXAM:
MRI HEAD WITHOUT CONTRAST
MRA HEAD WITHOUT CONTRAST
TECHNIQUE: Multiplanar, multi-echo pulse sequences of the brain and surrounding
structures were acquired without intravenous contrast. Angiographic
images of the Circle of Willis were acquired using MRA technique
without intravenous contrast.

[Series 5: DWI · axial · 3.0mm · 0.88mm/px · z∈[-152,-16]mm · 7 of 100 slices shown (1 of 4)]
[im 1/100]
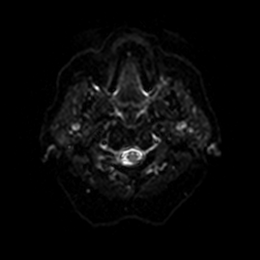
[im 17/100]
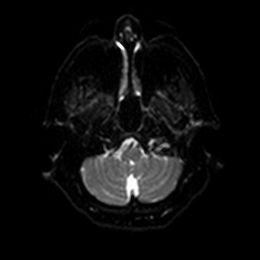
[im 34/100]
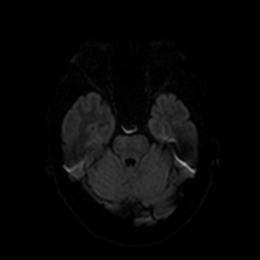
[im 50/100]
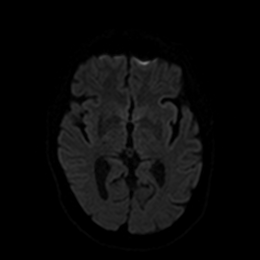
[im 67/100]
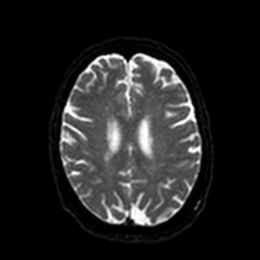
[im 83/100]
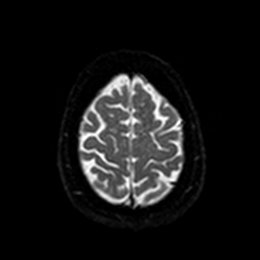
[im 100/100]
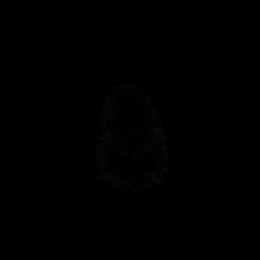

[Series 6: DWI · axial · 3.0mm · 0.88mm/px · z∈[-152,-16]mm · 4 of 50 slices shown (2 of 4)]
[im 1/50]
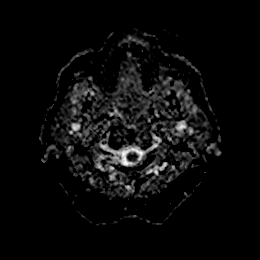
[im 17/50]
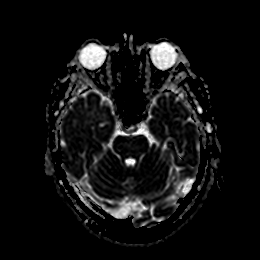
[im 33/50]
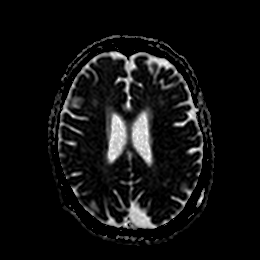
[im 50/50]
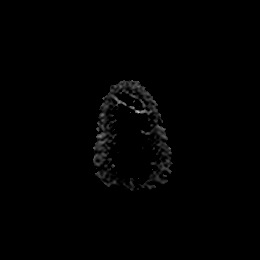

[Series 7: DWI · coronal · 4.0mm · 0.88mm/px · 6 of 72 slices shown (3 of 4)]
[im 1/72]
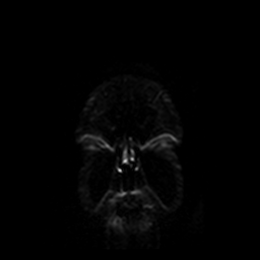
[im 15/72]
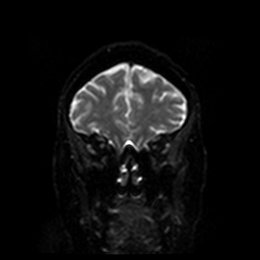
[im 29/72]
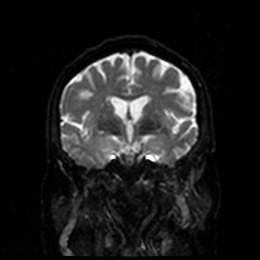
[im 43/72]
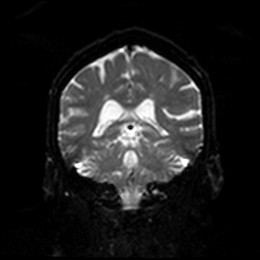
[im 57/72]
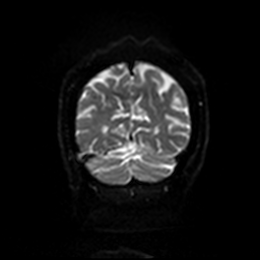
[im 72/72]
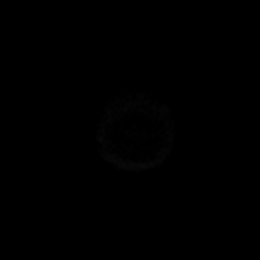

[Series 8: DWI · coronal · 4.0mm · 0.88mm/px · 3 of 36 slices shown (4 of 4)]
[im 1/36]
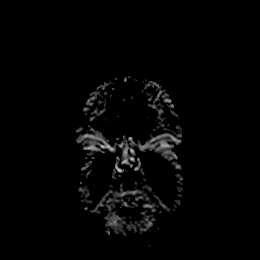
[im 18/36]
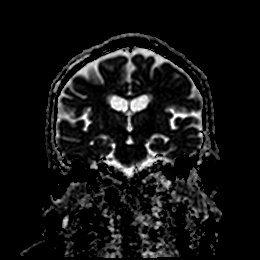
[im 36/36]
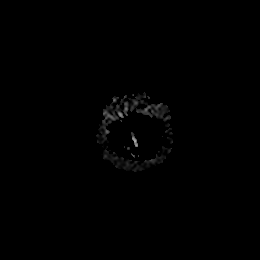

[Series 9: T1 · sagittal · 5.0mm · 0.75mm/px · 2 of 24 slices shown]
[im 1/24]
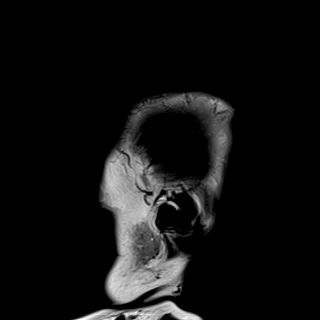
[im 24/24]
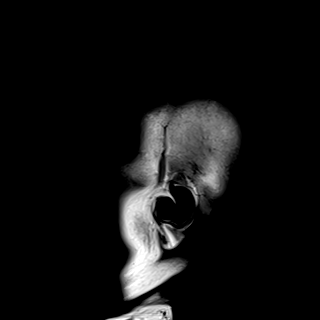

[Series 10: T2 · axial · 5.0mm · 0.72mm/px · z∈[-151,-18]mm · 2 of 25 slices shown (1 of 2)]
[im 1/25]
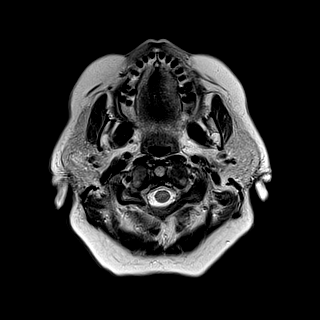
[im 25/25]
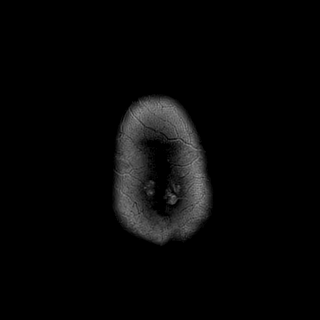

[Series 11: FLAIR · axial · 5.0mm · 0.45mm/px · z∈[-150,-16]mm · 2 of 25 slices shown]
[im 1/25]
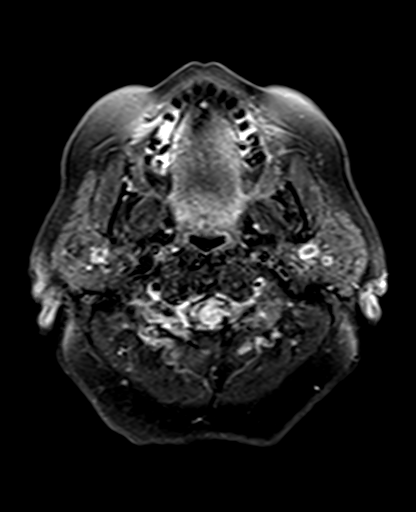
[im 25/25]
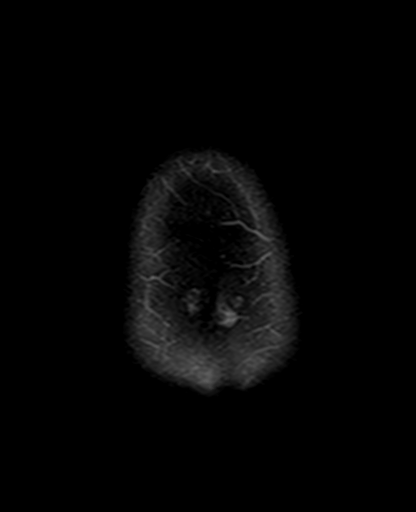

[Series 12: mag_images · axial · 3.0mm · 0.90mm/px · z∈[-154,-12]mm · 4 of 52 slices shown]
[im 1/52]
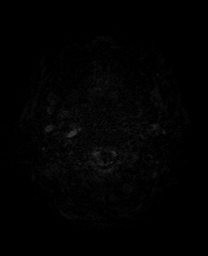
[im 18/52]
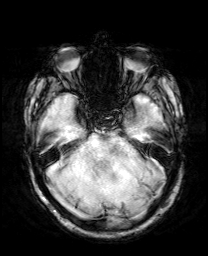
[im 35/52]
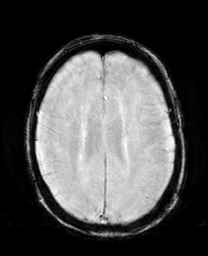
[im 52/52]
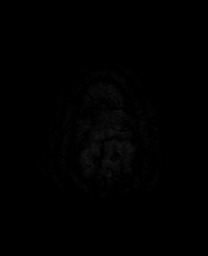

[Series 13: pha_images · axial · 3.0mm · 0.90mm/px · z∈[-151,-12]mm · 4 of 51 slices shown]
[im 1/51]
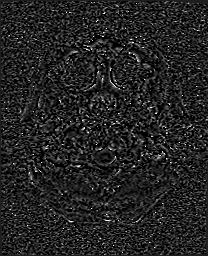
[im 17/51]
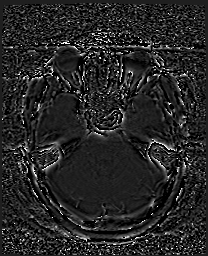
[im 34/51]
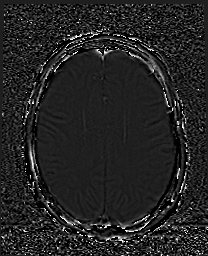
[im 51/51]
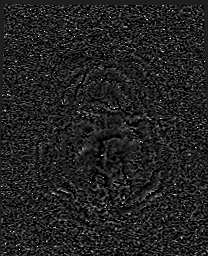

[Series 14: swi_images · axial · 3.0mm · 0.90mm/px · z∈[-154,-12]mm · 4 of 52 slices shown]
[im 1/52]
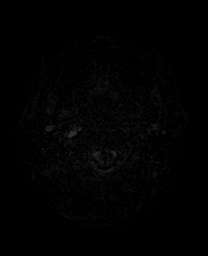
[im 18/52]
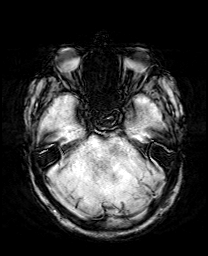
[im 35/52]
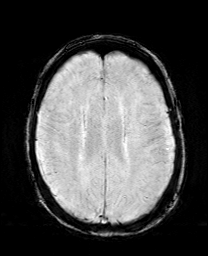
[im 52/52]
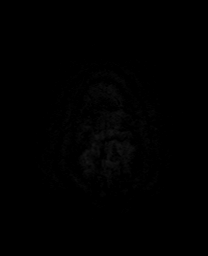

[Series 15: mip_images(sw) · axial · 24.0mm · 0.90mm/px · z∈[-144,-22]mm · 4 of 45 slices shown]
[im 1/45]
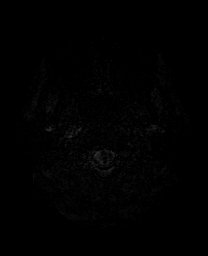
[im 15/45]
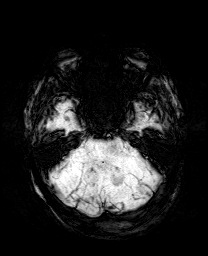
[im 30/45]
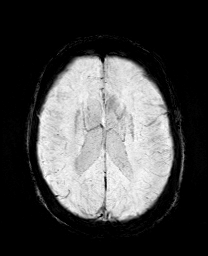
[im 45/45]
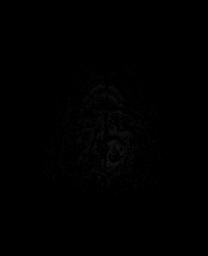

[Series 17: T2 · coronal · 5.0mm · 0.34mm/px · 2 of 29 slices shown (2 of 2)]
[im 1/29]
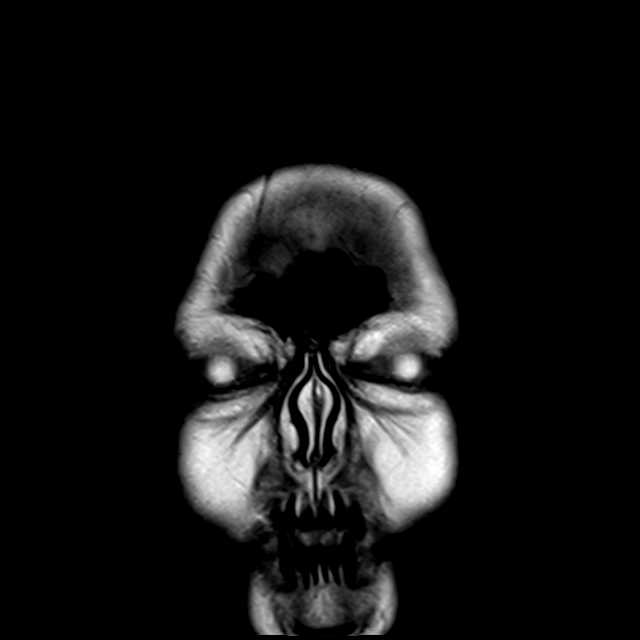
[im 29/29]
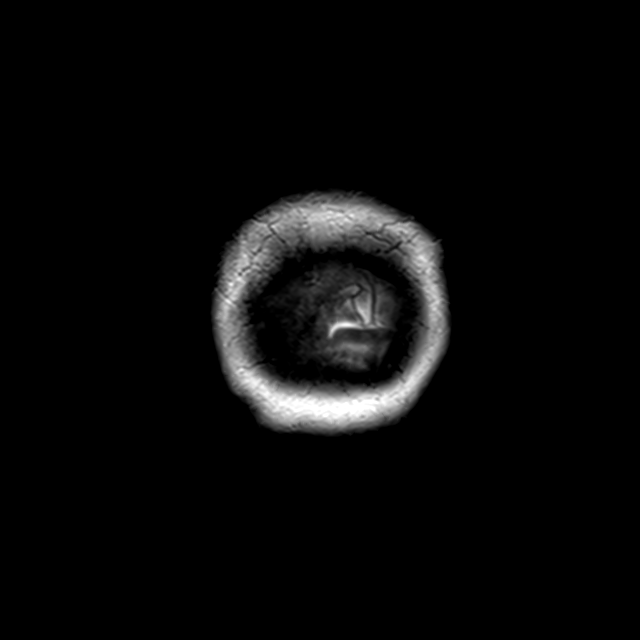

[44 of 48 positions shown; findings below may reference images not displayed]

FINDINGS: MRI HEAD FINDINGS

Brain: No acute infarction, hemorrhage, hydrocephalus, extra-axial
collection or mass lesion. Mild chronic small vessel ischemia in the
cerebral white matter. Age normal brain volume

Vascular: Normal flow voids.

Skull and upper cervical spine: Normal marrow signal.

Sinuses/Orbits: Bilateral cataract resection

MRA HEAD FINDINGS

Anterior circulation: No stenosis, beading, branch occlusion,
aneurysm, or vascular malformation.

Posterior circulation: The right vertebral artery is dominant. Fetal
type left PCA. No stenosis, branch occlusion, beading, or aneurysm.

Anatomic variants: As above
IMPRESSION: 1. No acute finding or explanation for symptoms.
2. Mild chronic small vessel ischemia.
3. Negative intracranial MRA.

## 2022-06-02 IMAGING — CT CT HEAD CODE STROKE
4 series · 17 of 47 positions shown, 19 images · non-contrast
Comparison: None.

CLINICAL DATA: Code stroke.  TIA with symptoms not specified

EXAM:
CT HEAD WITHOUT CONTRAST
TECHNIQUE: Contiguous axial images were obtained from the base of the skull
through the vertex without intravenous contrast.

[Series 2: head wo · axial · 0.44mm/px · z∈[-117,+3]mm · 7 of 32 slices shown, 9 images]
[im 4/32  brain]
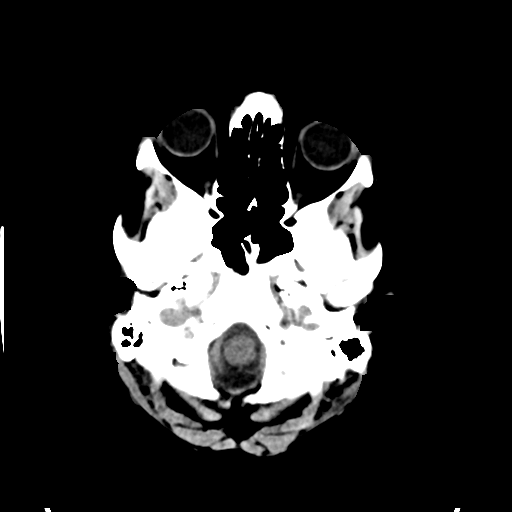
[im 4/32  bone]
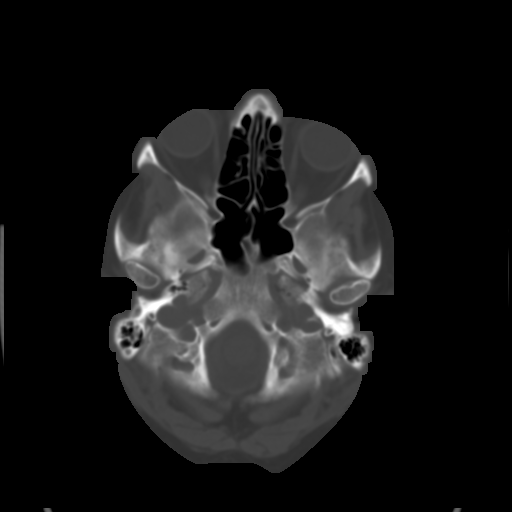
[im 8/32  brain]
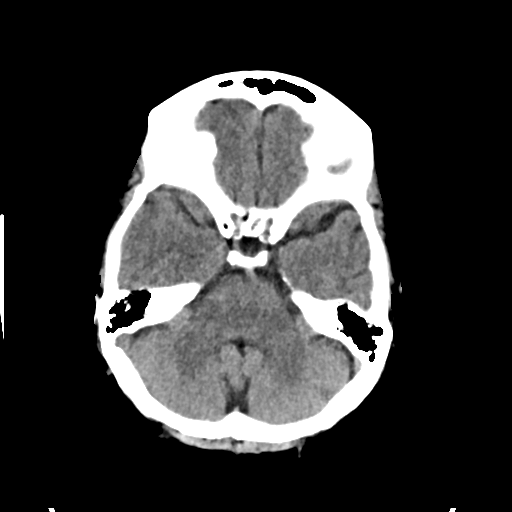
[im 12/32  brain]
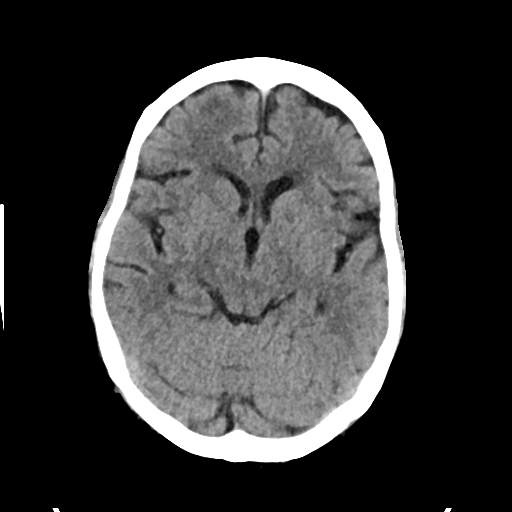
[im 16/32  brain]
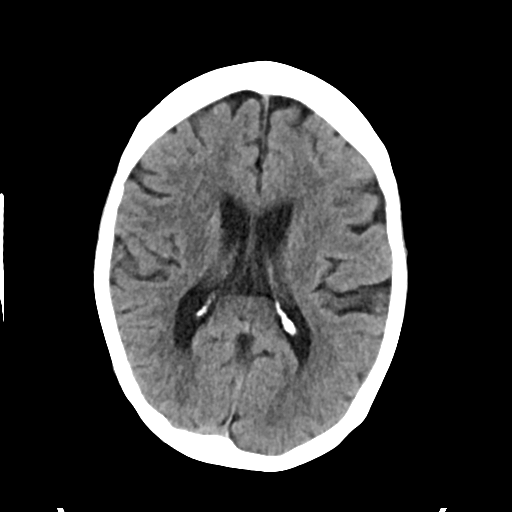
[im 20/32  brain]
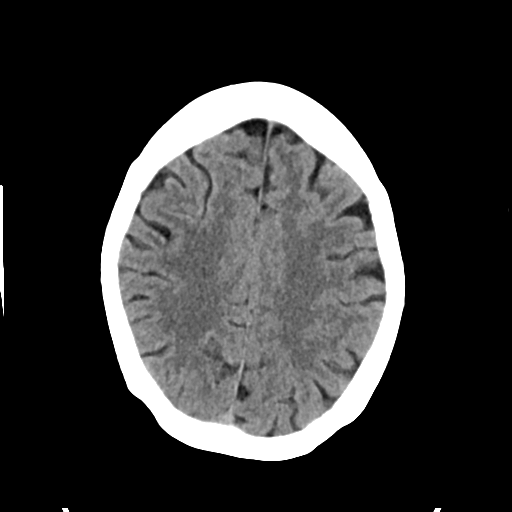
[im 20/32  bone]
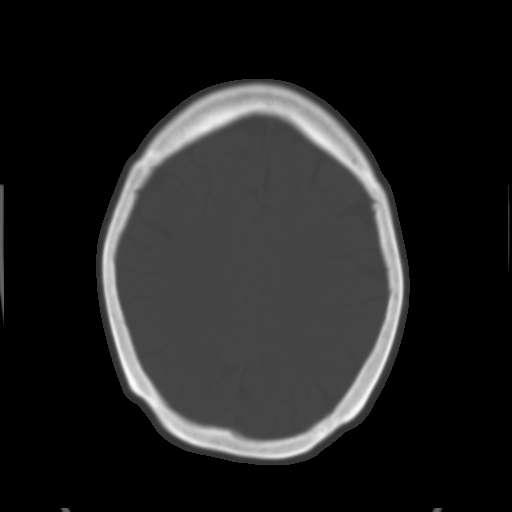
[im 24/32  brain]
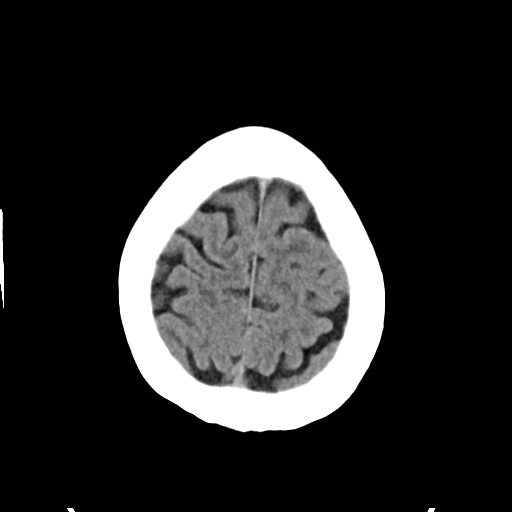
[im 28/32  brain]
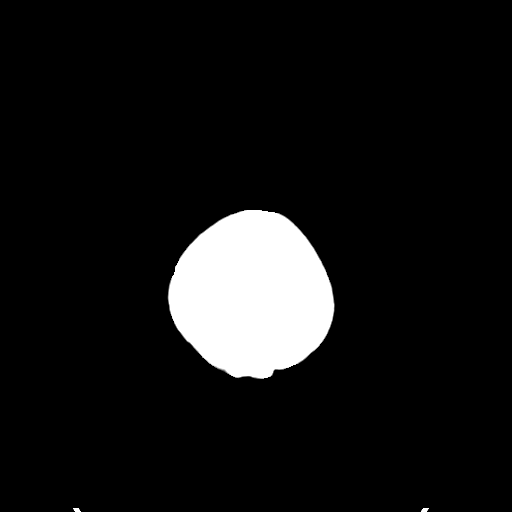

[Series 3: head bone · axial · 0.44mm/px · z∈[-118,-62]mm · 4 of 79 slices shown]
[im 8/79  bone]
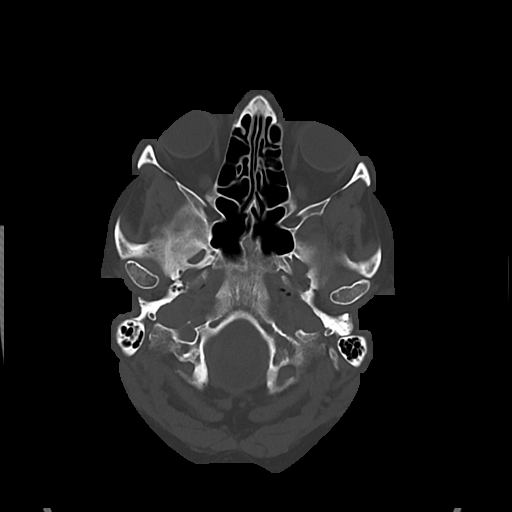
[im 16/79  bone]
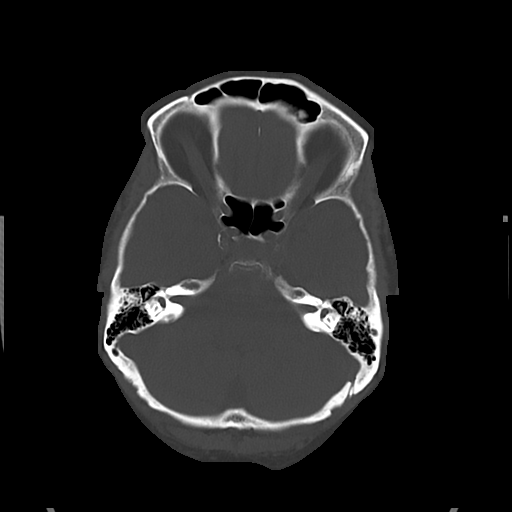
[im 24/79  bone]
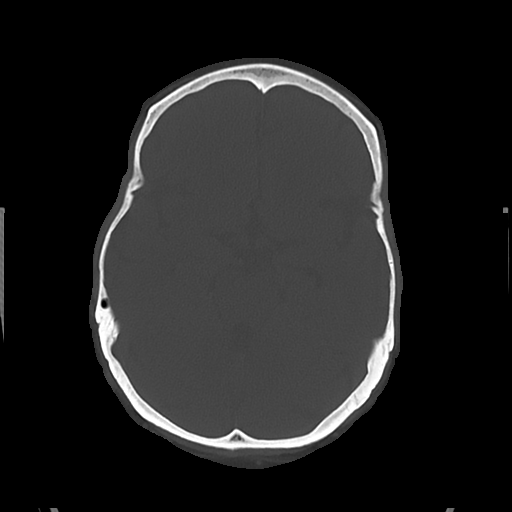
[im 36/79  bone]
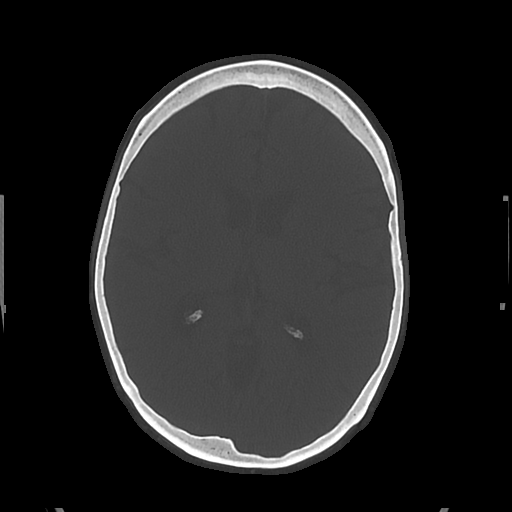

[Series 4: cor soft · coronal · 0.33mm/px · 3 of 64 slices shown]
[im 22/64  brain]
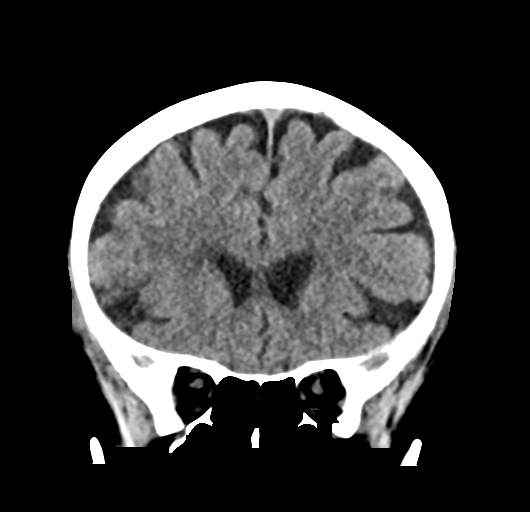
[im 29/64  brain]
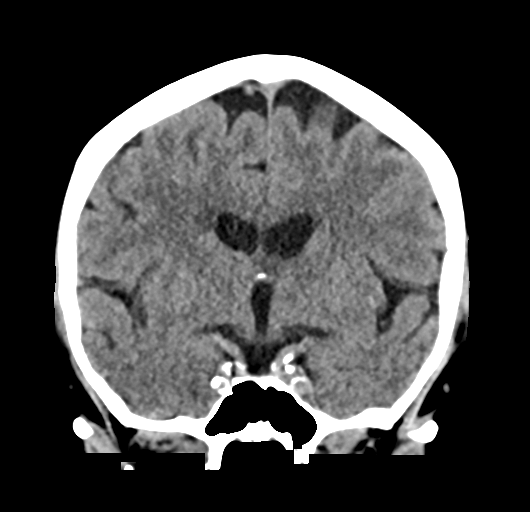
[im 36/64  brain]
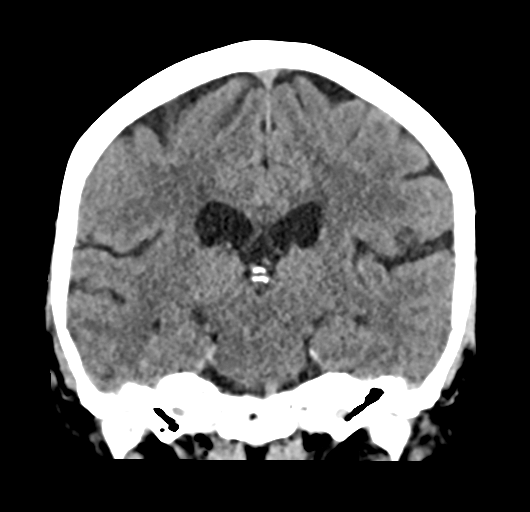

[Series 5: sag soft · sagittal · 0.33mm/px · 3 of 58 slices shown]
[im 20/58  brain]
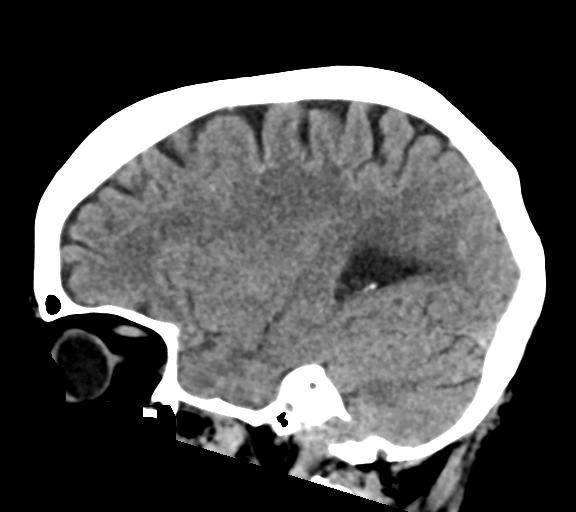
[im 29/58  brain]
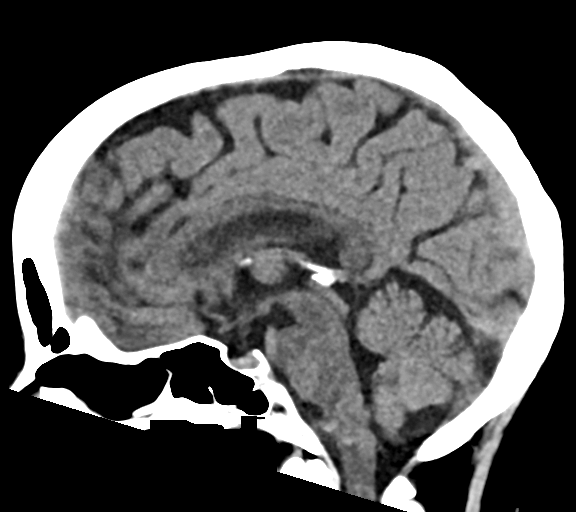
[im 39/58  brain]
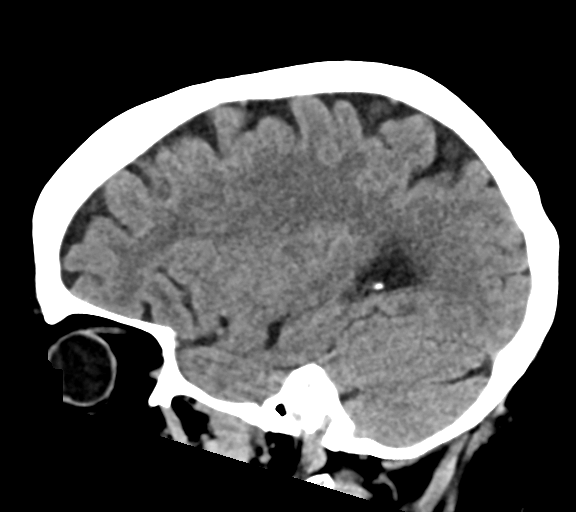

[17 of 47 positions shown; findings below may reference images not displayed]

FINDINGS: Brain: No evidence of acute infarction, hemorrhage, hydrocephalus,
extra-axial collection or mass lesion/mass effect. Mild
periventricular chronic small vessel ischemia. Age normal brain
volume

Vascular: No hyperdense vessel or unexpected calcification.

Skull: Normal. Negative for fracture or focal lesion.

Sinuses/Orbits: No acute finding.

ASPECTS ([place]ta Stroke Program Early CT Score)

Not scored with this history
IMPRESSION: No acute finding.  Unremarkable appearance of the brain for age.

## 2023-05-06 LAB — COLOGUARD: COLOGUARD: NEGATIVE

## 2024-04-09 ENCOUNTER — Encounter: Payer: Self-pay | Admitting: Emergency Medicine

## 2024-04-09 ENCOUNTER — Emergency Department

## 2024-04-09 ENCOUNTER — Emergency Department
Admission: EM | Admit: 2024-04-09 | Discharge: 2024-04-09 | Disposition: A | Discharge status: Home / Self Care | Attending: Emergency Medicine | Admitting: Emergency Medicine

## 2024-04-09 ENCOUNTER — Other Ambulatory Visit: Payer: Self-pay

## 2024-04-09 DIAGNOSIS — Y92410 Unspecified street and highway as the place of occurrence of the external cause: Secondary | ICD-10-CM | POA: Diagnosis not present

## 2024-04-09 DIAGNOSIS — S61411A Laceration without foreign body of right hand, initial encounter: Secondary | ICD-10-CM | POA: Insufficient documentation

## 2024-04-09 DIAGNOSIS — M25561 Pain in right knee: Secondary | ICD-10-CM | POA: Diagnosis not present

## 2024-04-09 DIAGNOSIS — S0990XA Unspecified injury of head, initial encounter: Secondary | ICD-10-CM | POA: Insufficient documentation

## 2024-04-09 DIAGNOSIS — I6782 Cerebral ischemia: Secondary | ICD-10-CM | POA: Diagnosis not present

## 2024-04-09 DIAGNOSIS — S6991XA Unspecified injury of right wrist, hand and finger(s), initial encounter: Secondary | ICD-10-CM
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
## 2024-04-10 ENCOUNTER — Other Ambulatory Visit (INDEPENDENT_AMBULATORY_CARE_PROVIDER_SITE_OTHER): Payer: Self-pay

## 2024-04-10 ENCOUNTER — Ambulatory Visit: Admitting: Orthopedic Surgery

## 2024-04-10 DIAGNOSIS — M79641 Pain in right hand: Secondary | ICD-10-CM

## 2024-04-11 ENCOUNTER — Encounter: Payer: Self-pay | Admitting: Orthopedic Surgery
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
## 2024-04-16 ENCOUNTER — Ambulatory Visit: Admitting: Orthopedic Surgery

## 2024-04-29 ENCOUNTER — Ambulatory Visit: Admitting: Physician Assistant

## 2024-04-29 ENCOUNTER — Encounter: Payer: Self-pay | Admitting: Physician Assistant

## 2024-04-29 ENCOUNTER — Other Ambulatory Visit: Payer: Self-pay

## 2024-04-29 DIAGNOSIS — M79671 Pain in right foot: Secondary | ICD-10-CM

## 2024-04-29 DIAGNOSIS — I890 Lymphedema, not elsewhere classified: Secondary | ICD-10-CM

## 2024-04-29 DIAGNOSIS — R600 Localized edema: Secondary | ICD-10-CM | POA: Diagnosis not present
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
## 2024-04-30 ENCOUNTER — Ambulatory Visit: Admitting: Physician Assistant

## 2024-04-30 ENCOUNTER — Telehealth: Payer: Self-pay

## 2024-04-30 ENCOUNTER — Encounter: Payer: Self-pay | Admitting: Physician Assistant

## 2024-04-30 DIAGNOSIS — I890 Lymphedema, not elsewhere classified: Secondary | ICD-10-CM

## 2024-04-30 DIAGNOSIS — I872 Venous insufficiency (chronic) (peripheral): Secondary | ICD-10-CM | POA: Diagnosis not present
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
## 2024-04-30 NOTE — Telephone Encounter (Signed)
 Pt is coming in today

## 2024-05-06 ENCOUNTER — Ambulatory Visit: Admitting: Physician Assistant

## 2024-05-06 ENCOUNTER — Encounter: Payer: Self-pay | Admitting: Physician Assistant

## 2024-05-06 DIAGNOSIS — I872 Venous insufficiency (chronic) (peripheral): Secondary | ICD-10-CM | POA: Diagnosis not present

## 2024-05-06 DIAGNOSIS — L97519 Non-pressure chronic ulcer of other part of right foot with unspecified severity: Secondary | ICD-10-CM | POA: Diagnosis not present

## 2024-05-06 DIAGNOSIS — I890 Lymphedema, not elsewhere classified: Secondary | ICD-10-CM

## 2024-05-06 MED ORDER — DOXYCYCLINE HYCLATE 100 MG PO CAPS: 100.0000 mg | ORAL_CAPSULE | Freq: Two times a day (BID) | ORAL | 0 refills | Status: DC
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
## 2024-05-07 ENCOUNTER — Telehealth: Payer: Self-pay | Admitting: Orthopedic Surgery

## 2024-05-07 NOTE — Telephone Encounter (Signed)
 Pt had an appt with Deland yesterday and pt states her doxycycline  was sent to a pharmacy she dont use anymore. Pt asking if medication can be sent to [facility] on [address]. Pt phone number is [phone].

## 2024-05-08 ENCOUNTER — Other Ambulatory Visit: Payer: Self-pay

## 2024-05-08 ENCOUNTER — Telehealth: Payer: Self-pay | Admitting: Physician Assistant

## 2024-05-08 ENCOUNTER — Other Ambulatory Visit: Payer: Self-pay | Admitting: Physician Assistant

## 2024-05-08 DIAGNOSIS — L03032 Cellulitis of left toe: Secondary | ICD-10-CM

## 2024-05-08 DIAGNOSIS — I872 Venous insufficiency (chronic) (peripheral): Secondary | ICD-10-CM

## 2024-05-08 MED ORDER — DOXYCYCLINE HYCLATE 100 MG PO CAPS: 100.0000 mg | ORAL_CAPSULE | Freq: Two times a day (BID) | ORAL | 0 refills | Status: DC

## 2024-05-08 NOTE — Telephone Encounter (Signed)
 Can you please change pharm and resend. Please see message below.

## 2024-05-08 NOTE — Telephone Encounter (Signed)
 Patient called and said that the medication went to the wrong store. It needs to be the [facility] pharmacy on [address]. CB#[phone]

## 2024-05-08 NOTE — Telephone Encounter (Signed)
 Deland has already taken care of this. Sent to [facility] on [address]

## 2024-05-11 ENCOUNTER — Ambulatory Visit (HOSPITAL_COMMUNITY)
Admission: RE | Admit: 2024-05-11 | Discharge: 2024-05-11 | Disposition: A | Discharge status: Home / Self Care | Source: Ambulatory Visit | Attending: Surgery | Admitting: Surgery

## 2024-05-11 DIAGNOSIS — I872 Venous insufficiency (chronic) (peripheral): Secondary | ICD-10-CM | POA: Diagnosis present

## 2024-05-25 ENCOUNTER — Ambulatory Visit: Admitting: Physician Assistant

## 2024-05-25 DIAGNOSIS — I890 Lymphedema, not elsewhere classified: Secondary | ICD-10-CM | POA: Diagnosis not present

## 2024-05-25 DIAGNOSIS — L03032 Cellulitis of left toe: Secondary | ICD-10-CM

## 2024-05-26 ENCOUNTER — Encounter: Payer: Self-pay | Admitting: Physician Assistant

## 2024-05-26 NOTE — Progress Notes (Signed)
 Office Visit Note   Patient: Stacey Conrad           Date of Birth: [birth year]           MRN: [id number] Visit Date: 05/25/2024              Requested by: Fernande Ophelia JINNY DOUGLAS, MD [address] [facility] [place],  [place] [address] PCP: Fernande Ophelia JINNY DOUGLAS, MD  Chief Complaint  Patient presents with  . Right Leg - Follow-up  . Left Leg - Follow-up      HPI: 73 y/o female with a negative venous reflux study.  She has weeping clear fluid from the skin.  Pinpoint superficial openings without cellulitis.  She has been placed in Vive compression socks which she is wearing daily.     She has bought the supplements for increased protein with L-arginine, fish oil, and vit C.  She cares for her husband and tries to take time to elevate her legs as much as she can.  Her daughter comes to the appoints with her and they are both a little concerned with her right foot GT ulcer and callus.  She denies fever and chills.  Assessment & Plan: Visit Diagnoses:  1. Cellulitis of left toe   2. Lymphedema     Plan: Vive compression socks. Continue to try to elevate multiple times a day to decrease the edema. Vashe dressing over GT ulcer daily.  She may benefit from lymphedema pumps in the future.    Follow-Up Instructions: Return in about 2 weeks (around 06/08/2024).   Ortho Exam  Patient is alert, oriented, no adenopathy, well-dressed, normal affect, normal respiratory effort. B LE have decreased edema without cellulitis.  The toe ulcer was debrided with a 10 blade to viable tissue.  No purulent drainage or erythema.      Imaging: No results found.   Labs: Lab Results  Component Value Date   HGBA1C 5.4 01/28/2021   HGBA1C 5.3 01/27/2021     Lab Results  Component Value Date   ALBUMIN 3.5 01/27/2021    Lab Results  Component Value Date   MG 2.2 01/27/2021   No results found for: VD25OH  No results found for: PREALBUMIN    Latest Ref Rng & Units 01/27/2021     3:42 AM 01/27/2021    3:21 AM  CBC EXTENDED  WBC 4.0 - 10.5 K/uL  7.0   RBC 3.87 - 5.11 MIL/uL  4.26   Hemoglobin 12.0 - 15.0 g/dL 86.6  86.2   HCT 63.9 - 46.0 % 39.0  41.7   Platelets 150 - 400 K/uL  158   NEUT# 1.7 - 7.7 K/uL  4.2   Lymph# 0.7 - 4.0 K/uL  1.9      There is no height or weight on file to calculate BMI.  Orders:  No orders of the defined types were placed in this encounter.  No orders of the defined types were placed in this encounter.    Procedures: No procedures performed  Clinical Data: No additional findings.  ROS:  All other systems negative, except as noted in the HPI. Review of Systems  Objective: Vital Signs: There were no vitals taken for this visit.  Specialty Comments:  No specialty comments available.  PMFS History: Patient Active Problem List   Diagnosis Date Noted  . TIA (transient ischemic attack) 01/27/2021  . Encephalopathy acute 01/27/2021  . Left-sided weakness 01/27/2021  . Acute encephalopathy 01/27/2021  Past Medical History:  Diagnosis Date  . Obesity     Family History  Problem Relation Age of Onset  . CAD Mother     History reviewed. No pertinent surgical history. Social History   Occupational History  . Not on file  Tobacco Use  . Smoking status: Unknown  . Smokeless tobacco: Not on file  Substance and Sexual Activity  . Alcohol use: Not on file  . Drug use: Not on file  . Sexual activity: Not on file

## 2024-06-08 ENCOUNTER — Ambulatory Visit: Admitting: Physician Assistant

## 2024-06-09 ENCOUNTER — Other Ambulatory Visit: Payer: Self-pay

## 2024-06-09 ENCOUNTER — Ambulatory Visit: Admitting: Physician Assistant

## 2024-06-09 DIAGNOSIS — M25571 Pain in right ankle and joints of right foot: Secondary | ICD-10-CM
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
## 2024-06-16 ENCOUNTER — Encounter: Attending: Physician Assistant | Admitting: Physician Assistant

## 2024-06-16 ENCOUNTER — Ambulatory Visit
Admission: RE | Admit: 2024-06-16 | Discharge: 2024-06-16 | Disposition: A | Discharge status: Home / Self Care | Source: Ambulatory Visit | Attending: Physician Assistant | Admitting: Physician Assistant

## 2024-06-16 ENCOUNTER — Ambulatory Visit
Admission: RE | Admit: 2024-06-16 | Discharge: 2024-06-16 | Disposition: A | Discharge status: Home / Self Care | Attending: Physician Assistant | Admitting: Physician Assistant

## 2024-06-16 ENCOUNTER — Other Ambulatory Visit: Payer: Self-pay | Admitting: Physician Assistant

## 2024-06-16 ENCOUNTER — Encounter: Payer: Self-pay | Admitting: Physician Assistant

## 2024-06-16 DIAGNOSIS — M79672 Pain in left foot: Secondary | ICD-10-CM

## 2024-06-16 DIAGNOSIS — I87323 Chronic venous hypertension (idiopathic) with inflammation of bilateral lower extremity: Secondary | ICD-10-CM | POA: Diagnosis not present

## 2024-06-16 DIAGNOSIS — I890 Lymphedema, not elsewhere classified: Secondary | ICD-10-CM | POA: Insufficient documentation

## 2024-06-16 DIAGNOSIS — L89893 Pressure ulcer of other site, stage 3: Secondary | ICD-10-CM | POA: Insufficient documentation

## 2024-06-18 ENCOUNTER — Encounter: Attending: Physician Assistant

## 2024-06-18 DIAGNOSIS — M86472 Chronic osteomyelitis with draining sinus, left ankle and foot: Secondary | ICD-10-CM | POA: Diagnosis not present

## 2024-06-18 DIAGNOSIS — I890 Lymphedema, not elsewhere classified: Secondary | ICD-10-CM | POA: Diagnosis not present

## 2024-06-18 DIAGNOSIS — I87323 Chronic venous hypertension (idiopathic) with inflammation of bilateral lower extremity: Secondary | ICD-10-CM | POA: Insufficient documentation

## 2024-06-18 DIAGNOSIS — L89894 Pressure ulcer of other site, stage 4: Secondary | ICD-10-CM | POA: Insufficient documentation

## 2024-06-22 ENCOUNTER — Encounter: Admitting: Physician Assistant

## 2024-06-22 ENCOUNTER — Telehealth: Payer: Self-pay | Admitting: Physician Assistant

## 2024-06-22 DIAGNOSIS — I87323 Chronic venous hypertension (idiopathic) with inflammation of bilateral lower extremity: Secondary | ICD-10-CM | POA: Diagnosis not present

## 2024-06-22 NOTE — Telephone Encounter (Signed)
 I have emailed [employer] to inform them of the change in appointment.

## 2024-06-22 NOTE — Telephone Encounter (Signed)
 Patient called and said the lymphedema person was supposed to come with the pumps needs to know they had to reschedule.CB#[phone]

## 2024-06-23 ENCOUNTER — Ambulatory Visit: Admitting: Physician Assistant

## 2024-06-24 ENCOUNTER — Other Ambulatory Visit: Payer: Self-pay | Admitting: Physician Assistant

## 2024-06-24 DIAGNOSIS — L89893 Pressure ulcer of other site, stage 3: Secondary | ICD-10-CM

## 2024-06-25 ENCOUNTER — Ambulatory Visit
Admission: RE | Admit: 2024-06-25 | Discharge: 2024-06-25 | Disposition: A | Discharge status: Home / Self Care | Source: Ambulatory Visit | Attending: Physician Assistant | Admitting: Physician Assistant

## 2024-06-25 DIAGNOSIS — L89893 Pressure ulcer of other site, stage 3: Secondary | ICD-10-CM | POA: Diagnosis present

## 2024-06-25 MED ORDER — GADOBUTROL 1 MMOL/ML IV SOLN
8.0000 mL | Freq: Once | INTRAVENOUS | Status: AC | PRN
  Administered 2024-06-25: 8 mL via INTRAVENOUS

## 2024-06-29 ENCOUNTER — Ambulatory Visit: Admitting: Physician Assistant

## 2024-06-30 ENCOUNTER — Encounter: Admitting: Physician Assistant

## 2024-06-30 DIAGNOSIS — I87323 Chronic venous hypertension (idiopathic) with inflammation of bilateral lower extremity: Secondary | ICD-10-CM | POA: Diagnosis not present

## 2024-07-02 ENCOUNTER — Encounter (HOSPITAL_COMMUNITY)

## 2024-07-08 ENCOUNTER — Other Ambulatory Visit: Payer: Self-pay | Admitting: Family Medicine

## 2024-07-08 ENCOUNTER — Encounter: Admitting: Physician Assistant

## 2024-07-08 DIAGNOSIS — I87323 Chronic venous hypertension (idiopathic) with inflammation of bilateral lower extremity: Secondary | ICD-10-CM | POA: Diagnosis not present

## 2024-07-08 DIAGNOSIS — R519 Headache, unspecified: Secondary | ICD-10-CM

## 2024-07-13 ENCOUNTER — Ambulatory Visit

## 2024-07-13 ENCOUNTER — Encounter: Admitting: Physician Assistant

## 2024-07-13 ENCOUNTER — Encounter

## 2024-07-13 DIAGNOSIS — I87323 Chronic venous hypertension (idiopathic) with inflammation of bilateral lower extremity: Secondary | ICD-10-CM | POA: Diagnosis not present

## 2024-07-14 ENCOUNTER — Encounter: Admitting: Physician Assistant

## 2024-07-14 DIAGNOSIS — I87323 Chronic venous hypertension (idiopathic) with inflammation of bilateral lower extremity: Secondary | ICD-10-CM | POA: Diagnosis not present

## 2024-07-15 ENCOUNTER — Encounter: Admitting: Physician Assistant

## 2024-07-15 DIAGNOSIS — I87323 Chronic venous hypertension (idiopathic) with inflammation of bilateral lower extremity: Secondary | ICD-10-CM | POA: Diagnosis not present

## 2024-07-16 ENCOUNTER — Encounter: Admitting: Physician Assistant

## 2024-07-16 DIAGNOSIS — I87323 Chronic venous hypertension (idiopathic) with inflammation of bilateral lower extremity: Secondary | ICD-10-CM | POA: Diagnosis not present

## 2024-07-20 ENCOUNTER — Encounter: Attending: Physician Assistant | Admitting: Physician Assistant

## 2024-07-20 ENCOUNTER — Encounter: Payer: Self-pay | Admitting: Radiology

## 2024-07-20 DIAGNOSIS — M86472 Chronic osteomyelitis with draining sinus, left ankle and foot: Secondary | ICD-10-CM | POA: Insufficient documentation

## 2024-07-20 DIAGNOSIS — I87323 Chronic venous hypertension (idiopathic) with inflammation of bilateral lower extremity: Secondary | ICD-10-CM | POA: Diagnosis not present

## 2024-07-20 DIAGNOSIS — L89894 Pressure ulcer of other site, stage 4: Secondary | ICD-10-CM | POA: Diagnosis present

## 2024-07-20 DIAGNOSIS — I890 Lymphedema, not elsewhere classified: Secondary | ICD-10-CM | POA: Insufficient documentation

## 2024-07-21 ENCOUNTER — Encounter: Admitting: Physician Assistant

## 2024-07-21 DIAGNOSIS — I87323 Chronic venous hypertension (idiopathic) with inflammation of bilateral lower extremity: Secondary | ICD-10-CM | POA: Diagnosis not present

## 2024-07-22 ENCOUNTER — Encounter: Admitting: Physician Assistant

## 2024-07-22 DIAGNOSIS — I87323 Chronic venous hypertension (idiopathic) with inflammation of bilateral lower extremity: Secondary | ICD-10-CM | POA: Diagnosis not present

## 2024-07-23 ENCOUNTER — Encounter: Admitting: Internal Medicine

## 2024-07-23 ENCOUNTER — Telehealth: Payer: Self-pay | Admitting: Orthopedic Surgery

## 2024-07-23 DIAGNOSIS — I87323 Chronic venous hypertension (idiopathic) with inflammation of bilateral lower extremity: Secondary | ICD-10-CM | POA: Diagnosis not present
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
## 2024-07-27 ENCOUNTER — Encounter: Admitting: Physician Assistant

## 2024-07-27 DIAGNOSIS — I87323 Chronic venous hypertension (idiopathic) with inflammation of bilateral lower extremity: Secondary | ICD-10-CM | POA: Diagnosis not present

## 2024-07-28 ENCOUNTER — Encounter: Admitting: Physician Assistant

## 2024-07-29 ENCOUNTER — Encounter: Admitting: Physician Assistant

## 2024-07-29 DIAGNOSIS — I87323 Chronic venous hypertension (idiopathic) with inflammation of bilateral lower extremity: Secondary | ICD-10-CM | POA: Diagnosis not present

## 2024-07-30 ENCOUNTER — Encounter: Admitting: Physician Assistant

## 2024-07-30 DIAGNOSIS — I87323 Chronic venous hypertension (idiopathic) with inflammation of bilateral lower extremity: Secondary | ICD-10-CM | POA: Diagnosis not present

## 2024-07-31 ENCOUNTER — Encounter: Admitting: Physician Assistant

## 2024-08-03 ENCOUNTER — Encounter: Admitting: Physician Assistant

## 2024-08-03 DIAGNOSIS — I87323 Chronic venous hypertension (idiopathic) with inflammation of bilateral lower extremity: Secondary | ICD-10-CM | POA: Diagnosis not present

## 2024-08-04 ENCOUNTER — Other Ambulatory Visit
Admission: RE | Admit: 2024-08-04 | Discharge: 2024-08-04 | Disposition: A | Discharge status: Home / Self Care | Source: Ambulatory Visit | Attending: Physician Assistant | Admitting: Physician Assistant

## 2024-08-04 ENCOUNTER — Encounter: Admitting: Physician Assistant

## 2024-08-04 DIAGNOSIS — M86472 Chronic osteomyelitis with draining sinus, left ankle and foot: Secondary | ICD-10-CM | POA: Insufficient documentation

## 2024-08-04 DIAGNOSIS — I87323 Chronic venous hypertension (idiopathic) with inflammation of bilateral lower extremity: Secondary | ICD-10-CM | POA: Diagnosis not present

## 2024-08-04 LAB — CBC WITH DIFFERENTIAL/PLATELET
Abs Immature Granulocytes: 0.05 K/uL (ref 0.00–0.07)
Basophils Absolute: 0 K/uL (ref 0.0–0.1)
Basophils Relative: 0 %
Eosinophils Absolute: 0.2 K/uL (ref 0.0–0.5)
Eosinophils Relative: 2 %
HCT: 35.1 % — ABNORMAL LOW (ref 36.0–46.0)
Hemoglobin: 11.4 g/dL — ABNORMAL LOW (ref 12.0–15.0)
Immature Granulocytes: 1 %
Lymphocytes Relative: 25 %
Lymphs Abs: 1.8 K/uL (ref 0.7–4.0)
MCH: 30.6 pg (ref 26.0–34.0)
MCHC: 32.5 g/dL (ref 30.0–36.0)
MCV: 94.4 fL (ref 80.0–100.0)
Monocytes Absolute: 0.7 K/uL (ref 0.1–1.0)
Monocytes Relative: 9 %
Neutro Abs: 4.5 K/uL (ref 1.7–7.7)
Neutrophils Relative %: 63 %
Platelets: 171 K/uL (ref 150–400)
RBC: 3.72 MIL/uL — ABNORMAL LOW (ref 3.87–5.11)
RDW: 13.5 % (ref 11.5–15.5)
WBC: 7.2 K/uL (ref 4.0–10.5)
nRBC: 0 % (ref 0.0–0.2)

## 2024-08-04 LAB — SEDIMENTATION RATE: Sed Rate: 11 mm/h (ref 0–30)

## 2024-08-04 LAB — C-REACTIVE PROTEIN: CRP: 0.8 mg/dL (ref <1.0)

## 2024-08-05 ENCOUNTER — Encounter: Admitting: Physician Assistant

## 2024-08-05 DIAGNOSIS — I87323 Chronic venous hypertension (idiopathic) with inflammation of bilateral lower extremity: Secondary | ICD-10-CM | POA: Diagnosis not present

## 2024-08-06 ENCOUNTER — Encounter: Admitting: Physician Assistant

## 2024-08-06 DIAGNOSIS — I87323 Chronic venous hypertension (idiopathic) with inflammation of bilateral lower extremity: Secondary | ICD-10-CM | POA: Diagnosis not present

## 2024-08-07 ENCOUNTER — Encounter: Admitting: Physician Assistant

## 2024-08-10 ENCOUNTER — Encounter: Admitting: Physician Assistant

## 2024-08-10 DIAGNOSIS — I87323 Chronic venous hypertension (idiopathic) with inflammation of bilateral lower extremity: Secondary | ICD-10-CM | POA: Diagnosis not present

## 2024-08-11 ENCOUNTER — Encounter: Admitting: Physician Assistant

## 2024-08-11 DIAGNOSIS — I87323 Chronic venous hypertension (idiopathic) with inflammation of bilateral lower extremity: Secondary | ICD-10-CM | POA: Diagnosis not present

## 2024-08-12 ENCOUNTER — Encounter: Admitting: Physician Assistant

## 2024-08-14 ENCOUNTER — Encounter: Admitting: Physician Assistant

## 2024-08-17 ENCOUNTER — Encounter: Admitting: Physician Assistant

## 2024-08-18 ENCOUNTER — Encounter: Admitting: Physician Assistant

## 2024-08-18 DIAGNOSIS — M86472 Chronic osteomyelitis with draining sinus, left ankle and foot: Secondary | ICD-10-CM | POA: Insufficient documentation

## 2024-08-18 DIAGNOSIS — I87323 Chronic venous hypertension (idiopathic) with inflammation of bilateral lower extremity: Secondary | ICD-10-CM | POA: Diagnosis not present

## 2024-08-18 DIAGNOSIS — I890 Lymphedema, not elsewhere classified: Secondary | ICD-10-CM | POA: Insufficient documentation

## 2024-08-18 DIAGNOSIS — L89894 Pressure ulcer of other site, stage 4: Secondary | ICD-10-CM | POA: Diagnosis present

## 2024-08-19 ENCOUNTER — Encounter: Admitting: Physician Assistant

## 2024-08-19 DIAGNOSIS — I87323 Chronic venous hypertension (idiopathic) with inflammation of bilateral lower extremity: Secondary | ICD-10-CM | POA: Diagnosis not present

## 2024-08-20 ENCOUNTER — Encounter: Admitting: Physician Assistant

## 2024-08-20 DIAGNOSIS — I87323 Chronic venous hypertension (idiopathic) with inflammation of bilateral lower extremity: Secondary | ICD-10-CM | POA: Diagnosis not present

## 2024-08-21 ENCOUNTER — Encounter: Admitting: Physician Assistant

## 2024-08-21 NOTE — Telephone Encounter (Signed)
 I have reached out to [employer] reps to see what we need to do now for the pumps. We put in an order back in August and order was cancelled by patient. Waiting for reply back on how to proceed.

## 2024-08-24 ENCOUNTER — Encounter: Admitting: Physician Assistant

## 2024-08-24 ENCOUNTER — Encounter: Admitting: Internal Medicine

## 2024-08-25 ENCOUNTER — Encounter: Admitting: Physician Assistant

## 2024-08-25 ENCOUNTER — Encounter: Admitting: Internal Medicine

## 2024-08-26 ENCOUNTER — Encounter: Admitting: Internal Medicine

## 2024-08-26 ENCOUNTER — Encounter: Admitting: Physician Assistant

## 2024-08-27 ENCOUNTER — Encounter: Admitting: Physician Assistant

## 2024-08-27 ENCOUNTER — Encounter: Admitting: Internal Medicine

## 2024-08-28 ENCOUNTER — Encounter: Admitting: Internal Medicine

## 2024-08-31 ENCOUNTER — Encounter: Admitting: Internal Medicine

## 2024-08-31 ENCOUNTER — Encounter: Admitting: Physician Assistant

## 2024-09-01 ENCOUNTER — Encounter: Admitting: Internal Medicine

## 2024-09-01 ENCOUNTER — Encounter: Admitting: Physician Assistant

## 2024-09-02 ENCOUNTER — Encounter: Admitting: Internal Medicine

## 2024-09-02 ENCOUNTER — Encounter: Admitting: Physician Assistant

## 2024-09-03 ENCOUNTER — Encounter: Admitting: Internal Medicine

## 2024-09-03 ENCOUNTER — Encounter: Admitting: Physician Assistant

## 2024-09-04 ENCOUNTER — Encounter: Admitting: Internal Medicine

## 2024-09-07 ENCOUNTER — Encounter: Admitting: Physician Assistant

## 2024-09-08 ENCOUNTER — Encounter: Admitting: Physician Assistant

## 2024-09-18 ENCOUNTER — Encounter: Admitting: Physician Assistant

## 2024-09-25 ENCOUNTER — Encounter: Attending: Physician Assistant | Admitting: Physician Assistant

## 2024-09-25 DIAGNOSIS — M86472 Chronic osteomyelitis with draining sinus, left ankle and foot: Secondary | ICD-10-CM | POA: Insufficient documentation

## 2024-09-25 DIAGNOSIS — I87323 Chronic venous hypertension (idiopathic) with inflammation of bilateral lower extremity: Secondary | ICD-10-CM | POA: Insufficient documentation

## 2024-09-25 DIAGNOSIS — I890 Lymphedema, not elsewhere classified: Secondary | ICD-10-CM | POA: Diagnosis not present

## 2024-09-25 DIAGNOSIS — L89894 Pressure ulcer of other site, stage 4: Secondary | ICD-10-CM | POA: Insufficient documentation

## 2024-09-26 ENCOUNTER — Ambulatory Visit
Admission: RE | Admit: 2024-09-26 | Discharge: 2024-09-26 | Disposition: A | Source: Ambulatory Visit | Attending: Family Medicine | Admitting: Family Medicine

## 2024-09-26 DIAGNOSIS — R519 Headache, unspecified: Secondary | ICD-10-CM | POA: Insufficient documentation

## 2024-09-26 MED ORDER — GADOBUTROL 1 MMOL/ML IV SOLN
7.0000 mL | Freq: Once | INTRAVENOUS | Status: AC | PRN
  Administered 2024-09-26: 7 mL via INTRAVENOUS

## 2024-10-02 ENCOUNTER — Telehealth: Payer: Self-pay | Admitting: Internal Medicine

## 2024-10-02 NOTE — Telephone Encounter (Signed)
 Called to confirm/remind patient of their appointment [facility] on 10[facility].   Appointment:   [x] Confirmed  [] Left mess   [] No answer/No voice mail  [] VM Full/unable to leave message  [] Phone not in service  Patient reminded to bring all medications and/or complete list.  Confirmed patient has transportation. Gave directions, instructed to utilize valet parking.

## 2024-10-05 ENCOUNTER — Ambulatory Visit: Attending: Internal Medicine | Admitting: Internal Medicine

## 2024-10-05 VITALS — BP 140/72 | HR 70 | Wt 177.8 lb

## 2024-10-05 DIAGNOSIS — I890 Lymphedema, not elsewhere classified: Secondary | ICD-10-CM | POA: Insufficient documentation

## 2024-10-05 DIAGNOSIS — R5383 Other fatigue: Secondary | ICD-10-CM | POA: Insufficient documentation

## 2024-10-05 DIAGNOSIS — R0609 Other forms of dyspnea: Secondary | ICD-10-CM | POA: Insufficient documentation

## 2024-10-05 DIAGNOSIS — R931 Abnormal findings on diagnostic imaging of heart and coronary circulation: Secondary | ICD-10-CM | POA: Insufficient documentation

## 2024-10-05 DIAGNOSIS — R0602 Shortness of breath: Secondary | ICD-10-CM | POA: Diagnosis not present

## 2024-10-05 DIAGNOSIS — R0683 Snoring: Secondary | ICD-10-CM | POA: Diagnosis not present

## 2024-10-05 DIAGNOSIS — G4719 Other hypersomnia: Secondary | ICD-10-CM

## 2024-10-05 DIAGNOSIS — N1831 Chronic kidney disease, stage 3a: Secondary | ICD-10-CM | POA: Diagnosis not present

## 2024-10-05 DIAGNOSIS — Z6831 Body mass index (BMI) 31.0-31.9, adult: Secondary | ICD-10-CM | POA: Diagnosis not present

## 2024-10-05 DIAGNOSIS — Z636 Dependent relative needing care at home: Secondary | ICD-10-CM | POA: Diagnosis not present

## 2024-10-05 DIAGNOSIS — I5032 Chronic diastolic (congestive) heart failure: Secondary | ICD-10-CM | POA: Diagnosis not present

## 2024-10-05 MED ORDER — POTASSIUM CHLORIDE CRYS ER 20 MEQ PO TBCR: 20.0000 meq | EXTENDED_RELEASE_TABLET | Freq: Every day | ORAL | 3 refills | Status: AC

## 2024-10-05 MED ORDER — FUROSEMIDE 20 MG PO TABS: 20.0000 mg | ORAL_TABLET | Freq: Every day | ORAL | 3 refills | Status: AC

## 2024-10-05 NOTE — Progress Notes (Signed)
"   ReDS Vest / Clip - 10/05/24 1500       ReDS Vest / Clip   Station Marker A    Ruler Value 29    ReDS Value Range Low volume    ReDS Actual Value 28          "

## 2024-10-05 NOTE — Progress Notes (Signed)
 "  ADVANCED HF CLINIC CONSULT NOTE  Referring Physician: Harvey Gaetana CROME, NP Primary Care: Harvey Gaetana CROME, NP Primary Cardiologist: None  Chief Complaint: Heart failure  HPI:  Stacey Conrad is a 74 y/o woman with CKD 3a, obesity, lymphedema and diastolic HF. Referred by Dr. Alla for further evaluation of her HF and abnormal echo.   Echo 5/22 EF 60-65% G1DD Echo 08/25/24 at [facility]: EF > 55% Normal RV G3 DD Trivial TR RVSP 25  She is here with her daughter who is an [employer] on [employer] at [employer]. She has been struggling with DOE and fatigue. She is taking care of her husband with end-stage ALS who can be very demanding of her time. Not getting much sleep. She also has had significant leg edema which has led to LE wounds/osteo.   Has had LE edema for a long time and was told she has lymphedema. Does not wear her stockings. Uses lymphedema pumps at time. Does not use diuretics. BP has been labile with white-coat HTN   Will fall asleep in mid conversation. + snoring  BNP 12/25: 62.0     Past Medical History:  Diagnosis Date   Obesity     Current Outpatient Medications  Medication Sig Dispense Refill   aspirin  EC 81 MG EC tablet Take 1 tablet (81 mg total) by mouth daily. Swallow whole. 30 tablet 11   b complex vitamins capsule Take 1 capsule by mouth daily as needed (when exercising).     ibuprofen (ADVIL) 200 MG tablet Take 200 mg by mouth every 6 (six) hours as needed for headache or moderate pain.     No current facility-administered medications for this visit.    Allergies[1]    Social History   Socioeconomic History   Marital status: Unknown    Spouse name: Not on file   Number of children: Not on file   Years of education: Not on file   Highest education level: Not on file  Occupational History   Not on file  Tobacco Use   Smoking status: Unknown   Smokeless tobacco: Not on file  Substance and Sexual Activity   Alcohol use: Not on file   Drug use: Not on file   Sexual  activity: Not on file  Other Topics Concern   Not on file  Social History Narrative   Not on file   Social Drivers of Health   Tobacco Use: Low Risk  (07/17/2024)   Received from [facility]   Patient History    Smoking Tobacco Use: Never    Smokeless Tobacco Use: Never    Passive Exposure: Not on file  Financial Resource Strain: Low Risk  (07/02/2024)   Received from [facility]   Overall Financial Resource Strain (CARDIA)    Difficulty of Paying Living Expenses: Not hard at all  Food Insecurity: No Food Insecurity (07/02/2024)   Received from [facility]   Epic    Within the past 12 months, you worried that your food would run out before you got the money to buy more.: Never true    Within the past 12 months, the food you bought just didn't last and you didn't have money to get more.: Never true  Transportation Needs: No Transportation Needs (07/02/2024)   Received from [facility] - Transportation    In the past 12 months, has lack of transportation kept you from medical appointments or from getting medications?:  No    Lack of Transportation (Non-Medical): No  Physical Activity: Not on file  Stress: Not on file  Social Connections: Not on file  Intimate Partner Violence: Not on file  Depression (EYV7-0): Not on file  Alcohol Screen: Not on file  Housing: Low Risk  (07/02/2024)   Received from [facility]   Epic    In the last 12 months, was there a time when you were not able to pay the mortgage or rent on time?: No    In the past 12 months, how many times have you moved where you were living?: 0    At any time in the past 12 months, were you homeless or living in a shelter (including now)?: No  Utilities: Not At Risk (07/02/2024)   Received from [facility]   Epic    In the past 12 months has the electric, gas, oil, or water company threatened to shut  off services in your home?: No  Health Literacy: Not on file      Family History  Problem Relation Age of Onset   CAD Mother     Vitals:   10/05/24 1415  BP: (!) 140/72  Pulse: 70  SpO2: 100%  Weight: 177 lb 12.8 oz (80.6 kg)    PHYSICAL EXAM: General:  Elderly. No respiratory difficulty HEENT: normal Neck: supple. no JVD. Carotids 2+ bilat; no bruits. No lymphadenopathy or thryomegaly appreciated. Cor: PMI nondisplaced. Regular rate & rhythm. No rubs, gallops or murmurs. Lungs: clear Abdomen: obesity soft, nontender, nondistended. No hepatosplenomegaly. No bruits or masses. Good bowel sounds. Extremities: no cyanosis, clubbing, rash, 3+ lymphedema Neuro: alert & oriented x 3, cranial nerves grossly intact. moves all 4 extremities w/o difficulty. Affect pleasant.  ECG: NSR 63 No ST-T wave abnormalities. Personally reviewed    ASSESSMENT & PLAN:  1. Persistent LE edema with potential grade 3 diastolic dysfunction on last echo - Echo 5/22 EF 60-65% G1DD - Echo 08/25/24 at [facility]: EF > 55% Normal RV G3 DD Trivial TR RVSP 25 - ReDS 28%  - I am unable to see echo images but unlikely to have G3 DD with normal LA size, normal BNP and RVSP of only 25 so I am not sure this is accurate. Will repeat echo. - I doubt significant HF component here suspect echo read may be incorrect. Presentation most c/w lymphedema - Plan - repeat echo - refer [facility] for lymphedema care - start lasix  20 with kcl 20 prn  2. Morbid obesity - Body mass index is 31.5 kg/m.  3. Fatigue/snoring - suspect OSA - will check HST   Toribio Fuel, MD  2:34 PM      [1]  Allergies Allergen Reactions   Lidocaine-Epinephrine (Pf) Swelling   "

## 2024-10-05 NOTE — Patient Instructions (Signed)
 Medication Changes:  START Furosemide  20 mg Daily  START Potassium 20 meq Daily  Testing/Procedures:  Your physician has requested that you have an echocardiogram. Echocardiography is a painless test that uses sound waves to create images of your heart. It provides your doctor with information about the size and shape of your heart and how well your hearts chambers and valves are working. This procedure takes approximately one hour. There are no restrictions for this procedure. Please do NOT wear cologne, perfume, aftershave, or lotions (deodorant is allowed). Please arrive 15 minutes prior to your appointment time. YOU WILL BE CALLED TO SCHEDULE THIS  Please note: We ask at that you not bring children with you during ultrasound (echo/ vascular) testing. Due to room size and safety concerns, children are not allowed in the ultrasound rooms during exams. Our front office staff cannot provide observation of children in our lobby area while testing is being conducted. An adult accompanying a patient to their appointment will only be allowed in the ultrasound room at the discretion of the ultrasound technician under special circumstances. We apologize for any inconvenience.  Your provider has recommended that you have a home sleep study ([facility] Test).  We have provided you with the equipment in our office today. Please go ahead and download the app. DO NOT OPEN OR TAMPER WITH THE BOX UNTIL WE ADVISE YOU TO DO SO. Once insurance has approved the test our office will call you with PIN number and approval to proceed with testing. Once you have completed the test you just dispose of the equipment, the information is automatically uploaded to us  via blue-tooth technology. If your test is positive for sleep apnea and you need a home CPAP machine you will be contacted by Dr Dorine office [facility]) to set this up.   Referrals:  You have been referred to [facility], they will call you to  schedule  Special Instructions // Education:  Do the following things EVERYDAY: Weigh yourself in the morning before breakfast. Write it down and keep it in a log. Take your medicines as prescribed Eat low salt foods--Limit salt (sodium) to 2000 mg per day.  Stay as active as you can everyday Limit all fluids for the day to less than 2 liters   Please wear your compression hose daily, place them on as soon as you get up in the morning and remove before you go to bed at night.   Follow-Up in: 2-3 months in our Wing office    If you have any questions or concerns before your next appointment please send us  a message through [employer] or call our office at [phone], If it is after office hours your call will be answered by our answering service and directed appropriately.     At the [facility], you and your health needs are our priority. We have a designated team specialized in the treatment of Heart Failure. This Care Team includes your primary Heart Failure Specialized Cardiologist (physician), Advanced Practice Providers (APPs- Physician Assistants and Nurse Practitioners), and Pharmacist who all work together to provide you with the care you need, when you need it.   You may see any of the following providers on your designated Care Team at your next follow up:  Dr. Toribio Fuel Dr. Ezra Shuck Dr. Ria Commander Dr. Odis Brownie Greig Mosses, NP Caffie Shed, [place] [address] [place], [place] Beckey Coe, NP Jordan Lee, NP Ellouise Class, NP Jaun Bash, PharmD

## 2024-10-05 NOTE — Progress Notes (Signed)
 ITAMAR home sleep study given to patient, all instructions explained, waiver signed, and CLOUDPAT registration complete.

## 2024-10-15 ENCOUNTER — Ambulatory Visit

## 2024-10-30 ENCOUNTER — Ambulatory Visit

## 2024-11-19 ENCOUNTER — Ambulatory Visit (HOSPITAL_BASED_OUTPATIENT_CLINIC_OR_DEPARTMENT_OTHER): Admitting: Cardiovascular Disease

## 2024-12-28 ENCOUNTER — Ambulatory Visit (HOSPITAL_COMMUNITY): Admitting: Internal Medicine
